# Patient Record
Sex: Male | Born: 1960
Health system: Southern US, Community
[De-identification: ages and names within clinical notes are randomized; demographics above are authoritative.]

## PROBLEM LIST (undated history)

## (undated) DIAGNOSIS — I10 Essential (primary) hypertension: Secondary | ICD-10-CM

## (undated) DIAGNOSIS — C801 Malignant (primary) neoplasm, unspecified: Secondary | ICD-10-CM

## (undated) DIAGNOSIS — F419 Anxiety disorder, unspecified: Secondary | ICD-10-CM

## (undated) DIAGNOSIS — E785 Hyperlipidemia, unspecified: Secondary | ICD-10-CM

## (undated) DIAGNOSIS — E119 Type 2 diabetes mellitus without complications: Secondary | ICD-10-CM

## (undated) DIAGNOSIS — K219 Gastro-esophageal reflux disease without esophagitis: Secondary | ICD-10-CM

## (undated) DIAGNOSIS — Z8673 Personal history of transient ischemic attack (TIA), and cerebral infarction without residual deficits: Secondary | ICD-10-CM

## (undated) DIAGNOSIS — K760 Fatty (change of) liver, not elsewhere classified: Secondary | ICD-10-CM

## (undated) HISTORY — DX: Essential (primary) hypertension: I10

## (undated) HISTORY — DX: Type 2 diabetes mellitus without complications: E11.9

## (undated) HISTORY — DX: Fatty (change of) liver, not elsewhere classified: K76.0

## (undated) HISTORY — DX: Personal history of transient ischemic attack (TIA), and cerebral infarction without residual deficits: Z86.73

## (undated) HISTORY — PX: NO PAST SURGERIES: SHX2092

## (undated) HISTORY — DX: Anxiety disorder, unspecified: F41.9

## (undated) HISTORY — DX: Hyperlipidemia, unspecified: E78.5

---

## 2003-07-27 ENCOUNTER — Ambulatory Visit (HOSPITAL_COMMUNITY): Admission: RE | Admit: 2003-07-27 | Discharge: 2003-07-27 | Payer: Self-pay | Admitting: Family Medicine

## 2004-01-08 ENCOUNTER — Ambulatory Visit (HOSPITAL_COMMUNITY): Admission: RE | Admit: 2004-01-08 | Discharge: 2004-01-08 | Payer: Self-pay | Admitting: Internal Medicine

## 2004-01-08 ENCOUNTER — Encounter (INDEPENDENT_AMBULATORY_CARE_PROVIDER_SITE_OTHER): Payer: Self-pay | Admitting: Specialist

## 2004-02-26 ENCOUNTER — Ambulatory Visit (HOSPITAL_COMMUNITY): Admission: RE | Admit: 2004-02-26 | Discharge: 2004-02-26 | Payer: Self-pay | Admitting: Internal Medicine

## 2004-04-22 ENCOUNTER — Ambulatory Visit: Payer: Self-pay | Admitting: Internal Medicine

## 2004-05-02 ENCOUNTER — Ambulatory Visit (HOSPITAL_COMMUNITY): Admission: RE | Admit: 2004-05-02 | Discharge: 2004-05-02 | Payer: Self-pay | Admitting: Internal Medicine

## 2004-11-17 ENCOUNTER — Ambulatory Visit (HOSPITAL_COMMUNITY): Admission: RE | Admit: 2004-11-17 | Discharge: 2004-11-17 | Payer: Self-pay | Admitting: Internal Medicine

## 2005-04-03 ENCOUNTER — Ambulatory Visit (HOSPITAL_COMMUNITY): Admission: RE | Admit: 2005-04-03 | Discharge: 2005-04-03 | Payer: Self-pay | Admitting: Internal Medicine

## 2005-06-04 ENCOUNTER — Encounter: Admission: RE | Admit: 2005-06-04 | Discharge: 2005-06-04 | Payer: Self-pay | Admitting: Occupational Medicine

## 2007-07-04 ENCOUNTER — Ambulatory Visit (HOSPITAL_COMMUNITY): Admission: RE | Admit: 2007-07-04 | Discharge: 2007-07-04 | Payer: Self-pay | Admitting: Family Medicine

## 2007-08-24 ENCOUNTER — Ambulatory Visit (HOSPITAL_COMMUNITY): Admission: RE | Admit: 2007-08-24 | Discharge: 2007-08-24 | Payer: Self-pay | Admitting: Family Medicine

## 2008-11-15 ENCOUNTER — Ambulatory Visit (HOSPITAL_COMMUNITY): Admission: RE | Admit: 2008-11-15 | Discharge: 2008-11-15 | Payer: Self-pay | Admitting: Family Medicine

## 2010-01-17 ENCOUNTER — Ambulatory Visit (HOSPITAL_COMMUNITY): Admission: RE | Admit: 2010-01-17 | Discharge: 2010-01-17 | Payer: Self-pay | Admitting: Urology

## 2010-08-08 LAB — HEMOGLOBIN AND HEMATOCRIT, BLOOD: HCT: 49.8 % (ref 39.0–52.0)

## 2010-08-08 LAB — BASIC METABOLIC PANEL
Calcium: 9.8 mg/dL (ref 8.4–10.5)
Chloride: 105 mEq/L (ref 96–112)
Creatinine, Ser: 0.94 mg/dL (ref 0.4–1.5)
Potassium: 4.2 mEq/L (ref 3.5–5.1)

## 2010-08-08 LAB — SURGICAL PCR SCREEN: MRSA, PCR: NEGATIVE

## 2010-08-08 LAB — GLUCOSE, CAPILLARY: Glucose-Capillary: 109 mg/dL — ABNORMAL HIGH (ref 70–99)

## 2012-10-21 ENCOUNTER — Ambulatory Visit: Payer: Self-pay | Admitting: Family Medicine

## 2012-10-26 ENCOUNTER — Ambulatory Visit (INDEPENDENT_AMBULATORY_CARE_PROVIDER_SITE_OTHER): Payer: BC Managed Care – PPO | Admitting: Family Medicine

## 2012-10-26 ENCOUNTER — Encounter: Payer: Self-pay | Admitting: Family Medicine

## 2012-10-26 VITALS — BP 170/90 | HR 90 | Wt 163.0 lb

## 2012-10-26 DIAGNOSIS — I10 Essential (primary) hypertension: Secondary | ICD-10-CM | POA: Insufficient documentation

## 2012-10-26 DIAGNOSIS — E119 Type 2 diabetes mellitus without complications: Secondary | ICD-10-CM | POA: Insufficient documentation

## 2012-10-26 DIAGNOSIS — E785 Hyperlipidemia, unspecified: Secondary | ICD-10-CM

## 2012-10-26 MED ORDER — AMLODIPINE BESYLATE 2.5 MG PO TABS: 2.5000 mg | ORAL_TABLET | Freq: Every day | ORAL | Status: DC

## 2012-10-26 MED ORDER — BISOPROLOL-HYDROCHLOROTHIAZIDE 10-6.25 MG PO TABS: 1.0000 | ORAL_TABLET | Freq: Every day | ORAL | Status: DC

## 2012-10-26 MED ORDER — RAMIPRIL 10 MG PO CAPS: 10.0000 mg | ORAL_CAPSULE | Freq: Every day | ORAL | Status: DC

## 2012-10-26 MED ORDER — METFORMIN HCL 500 MG PO TABS: ORAL_TABLET | ORAL | Status: DC

## 2012-10-26 MED ORDER — GLIMEPIRIDE 4 MG PO TABS: ORAL_TABLET | ORAL | Status: DC

## 2012-10-26 NOTE — Patient Instructions (Addendum)
Start checking sugars every morning--fasting. Call us in a couple weeks with numbers

## 2012-10-26 NOTE — Progress Notes (Signed)
  Subjective:    Patient ID: Victor Gonzales, male    DOB: [birth year], 52 y.o.   MRN: [id number]  Diabetes He has type 2 diabetes mellitus. His disease course has been stable. Pertinent negatives for hypoglycemia include no confusion or dizziness. Pertinent negatives for diabetes include no blurred vision and no chest pain. Pertinent negatives for hypoglycemia complications include no blackouts. Symptoms are stable. Pertinent negatives for diabetic complications include no nephropathy or retinopathy. Risk factors for coronary artery disease include diabetes mellitus. Current diabetic treatment includes oral agent (dual therapy). He is compliant with treatment most of the time. He is following a generally healthy diet. Meal planning includes avoidance of concentrated sweets. He has not had a previous visit with a dietician. He participates in exercise intermittently. There is no change in his home blood glucose trend. His breakfast blood glucose is taken between 7-8 am. His breakfast blood glucose range is generally 130-140 mg/dl. An ACE inhibitor/angiotensin II receptor blocker is being taken. Eye exam is current.   Results for orders placed in visit on 10/26/12  POCT GLYCOSYLATED HEMOGLOBIN (HGB A1C)      Result Value Range   Hemoglobin A1C 8.0     patient also has history of hypertension. He claims compliance with his medication. Generally when blood pressures are checked elsewhere it is in good range. Next  Patient is having unusual spells. He will suddenly feel lightheaded. This is often accompanied by a flash of light sensation. Generally lasts under 1 minute. Patient has been assuming it is because of low sugars. But he is not sure.  Patient also has history of hyperlipidemia. He is trying hard to watch his diet.  Review of Systems  Eyes: Negative for blurred vision.  Cardiovascular: Negative for chest pain.  Neurological: Negative for dizziness.  Psychiatric/Behavioral: Negative for  confusion.       Objective:   Physical Exam Alert no acute distress. HEENT normal. Blood pressure improved on repeat 146/88. Lungs clear. Heart regular rate and rhythm. Feet exam sensation intact pulses good      Assessment & Plan:  Impression #1 type 2 diabetes suboptimum A1c discussed at length. Medications reviewed. #2 hypertension controlled good. #3 hyperlipidemia #4 spells somewhat curious. Not really consistent with any clear etiology. Discussed with family. Plan medications prescribed. Diet discussed. Exercise encourage. Recheck in several months. If A1c not improved then will adjust medicines further. Easily 35 minutes spent most in discussion. WSL

## 2012-10-31 DIAGNOSIS — E785 Hyperlipidemia, unspecified: Secondary | ICD-10-CM | POA: Insufficient documentation

## 2013-01-25 ENCOUNTER — Encounter: Payer: Self-pay | Admitting: Family Medicine

## 2013-01-25 ENCOUNTER — Ambulatory Visit (INDEPENDENT_AMBULATORY_CARE_PROVIDER_SITE_OTHER): Payer: BC Managed Care – PPO | Admitting: Family Medicine

## 2013-01-25 VITALS — BP 122/88 | Ht 68.5 in | Wt 162.0 lb

## 2013-01-25 DIAGNOSIS — F411 Generalized anxiety disorder: Secondary | ICD-10-CM

## 2013-01-25 DIAGNOSIS — I10 Essential (primary) hypertension: Secondary | ICD-10-CM

## 2013-01-25 DIAGNOSIS — E785 Hyperlipidemia, unspecified: Secondary | ICD-10-CM

## 2013-01-25 DIAGNOSIS — E119 Type 2 diabetes mellitus without complications: Secondary | ICD-10-CM

## 2013-01-25 MED ORDER — ESCITALOPRAM OXALATE 10 MG PO TABS: 10.0000 mg | ORAL_TABLET | Freq: Every day | ORAL | Status: DC

## 2013-01-25 NOTE — Progress Notes (Signed)
  Subjective:    Patient ID: Victor Gonzales, male    DOB: 1960-11-15, 52 y.o.   MRN: [id number]  HPIPatient here for diabetes check up. Amaryl was prescribed for 1 qam and 1/2 at noon. Pt states he was only taking 1 qam.  Results for orders placed in visit on 01/25/13  POCT GLYCOSYLATED HEMOGLOBIN (HGB A1C)      Result Value Range   Hemoglobin A1C 6.1       Patient has been having fatigue, anxiety, and weight loss x 2 months.  Trying to eat better, 120 to 130.   Taking amaryl daily. Compliant with medication. No obvious side effects.   Feels tired nd stressed, feeling loss from sister passing on. Things start closing in, and gets very stress.feeling down. Back in 94 took meds for stress.  Trouble sleeping, not good.  Staying active, lot of walking with the job  Things changing at work, stress with that.energy level decent.          Review of Systems No chest pain no abdominal pain review systems otherwise negative    Objective:   Physical Exam  Alert no acute distress. HEENT normal. Lungs clear. Heart regular in rhythm. Ankles without edema feet sensation intact pulses good.      Assessment & Plan:  Impression #1 type 2 diabetes much improved discussed. #2 hypertension good control discussed. #3 generalized anxiety disorder with secondary symptoms discussed at great length. This is starting to create difficulties for the patient. He would like to start treatment. Plan initiate Lexapro 10 mg daily. Maintain other medications. Diet exercise discussed. Recheck in 3 months for a wellness exam. WSL 25 minutes spent most in discussion

## 2013-01-25 NOTE — Patient Instructions (Signed)
plz call us ten days ahead of the next visit, we'll

## 2013-01-26 DIAGNOSIS — F411 Generalized anxiety disorder: Secondary | ICD-10-CM | POA: Insufficient documentation

## 2013-04-24 ENCOUNTER — Telehealth: Payer: Self-pay | Admitting: Family Medicine

## 2013-04-24 NOTE — Telephone Encounter (Signed)
Patient had bloodwork done at his job. These labs included blood pressure, glucose, triglycerides, cholestrol, HDL and LDL, body mass, PSA. He is wanting to know if any other labs are needed?  He has an appointment this week and I told him to be sure to bring copy of those labs he had done.

## 2013-04-24 NOTE — Telephone Encounter (Signed)
Ok stick with that for now

## 2013-04-24 NOTE — Telephone Encounter (Signed)
Notified wife that the blood work ordered was fine. She verbalized understanding.

## 2013-04-26 ENCOUNTER — Encounter: Payer: Self-pay | Admitting: Family Medicine

## 2013-04-26 ENCOUNTER — Ambulatory Visit (INDEPENDENT_AMBULATORY_CARE_PROVIDER_SITE_OTHER): Payer: BC Managed Care – PPO | Admitting: Family Medicine

## 2013-04-26 VITALS — BP 118/78 | Ht 68.5 in | Wt 166.5 lb

## 2013-04-26 DIAGNOSIS — G4700 Insomnia, unspecified: Secondary | ICD-10-CM

## 2013-04-26 DIAGNOSIS — E782 Mixed hyperlipidemia: Secondary | ICD-10-CM

## 2013-04-26 DIAGNOSIS — Z125 Encounter for screening for malignant neoplasm of prostate: Secondary | ICD-10-CM

## 2013-04-26 DIAGNOSIS — Z79899 Other long term (current) drug therapy: Secondary | ICD-10-CM

## 2013-04-26 DIAGNOSIS — E119 Type 2 diabetes mellitus without complications: Secondary | ICD-10-CM

## 2013-04-26 LAB — POCT GLYCOSYLATED HEMOGLOBIN (HGB A1C): Hemoglobin A1C: 7.5

## 2013-04-26 MED ORDER — GLIMEPIRIDE 4 MG PO TABS: ORAL_TABLET | ORAL | Status: DC

## 2013-04-26 NOTE — Progress Notes (Signed)
   Subjective:    Patient ID: Victor Gonzales, male    DOB: 14-Mar-1961, 52 y.o.   MRN: [id number]  HPI  Patient arrives for a follow up on diabetes. Unfortunately has not been checking his sugar recently. When he did check the morning numbers were starting to elevate. Also admits to dietary noncompliance at times. Claims full compliance with medication.  Patient having problems sleeping at night. Reports considerable stress. Trouble getting to sleep at night. Worried a lot about various pains. No suicidal or homicidal thoughts  Compliant with blood pressure medicine. Trying to watch salt intake. Walking some.  Not cking sugars as much  Results for orders placed in visit on 04/26/13  POCT GLYCOSYLATED HEMOGLOBIN (HGB A1C)      Result Value Range   Hemoglobin A1C 7.5     Flu shot already given  Trying to watch diet better. Getting better with oral intake. Also dirinking less soft drink.    Review of Systems No headache no chest pain no abdominal pain no change in bowel habits ROS otherwise negative    Objective:   Physical Exam  Alert hydration good. HEENT normal neck supple. Lungs clear. Heart regular in rhythm. Ankles trace edema. Venous stasis changes noted.      Assessment & Plan:  Impression 1 type 2 diabetes suboptimum control. #2 hypertension good control. #3 insomnia discussed at length. Plan Klonopin 1 mg each bedtime when necessary for sleep. Maintain same blood pressure medication. Increase Amaryl to one 4 mg tablet in the morning and half to 2 mg tablet at night. Recheck in several months. Diet exercise discussed. WSL

## 2013-04-29 LAB — HEPATIC FUNCTION PANEL
ALT: 66 U/L — ABNORMAL HIGH (ref 0–53)
Albumin: 4.1 g/dL (ref 3.5–5.2)
Alkaline Phosphatase: 55 U/L (ref 39–117)
Indirect Bilirubin: 0.7 mg/dL (ref 0.0–0.9)

## 2013-04-29 LAB — BASIC METABOLIC PANEL
BUN: 13 mg/dL (ref 6–23)
Chloride: 100 mEq/L (ref 96–112)
Potassium: 4.9 mEq/L (ref 3.5–5.3)
Sodium: 139 mEq/L (ref 135–145)

## 2013-04-29 LAB — LIPID PANEL
HDL: 42 mg/dL (ref >39)
Total CHOL/HDL Ratio: 3.8 Ratio
Triglycerides: 90 mg/dL (ref <150)

## 2013-06-06 ENCOUNTER — Other Ambulatory Visit: Payer: Self-pay | Admitting: Family Medicine

## 2013-06-22 ENCOUNTER — Other Ambulatory Visit: Payer: Self-pay | Admitting: Family Medicine

## 2013-07-25 ENCOUNTER — Ambulatory Visit: Payer: BC Managed Care – PPO | Admitting: Family Medicine

## 2013-08-10 ENCOUNTER — Other Ambulatory Visit: Payer: Self-pay | Admitting: *Deleted

## 2013-08-10 ENCOUNTER — Encounter: Payer: Self-pay | Admitting: Family Medicine

## 2013-08-10 ENCOUNTER — Ambulatory Visit (INDEPENDENT_AMBULATORY_CARE_PROVIDER_SITE_OTHER): Payer: BC Managed Care – PPO | Admitting: Family Medicine

## 2013-08-10 ENCOUNTER — Telehealth: Payer: Self-pay | Admitting: Family Medicine

## 2013-08-10 VITALS — BP 154/90 | Ht 68.5 in | Wt 166.0 lb

## 2013-08-10 DIAGNOSIS — E119 Type 2 diabetes mellitus without complications: Secondary | ICD-10-CM

## 2013-08-10 LAB — POCT GLYCOSYLATED HEMOGLOBIN (HGB A1C): Hemoglobin A1C: 8.2

## 2013-08-10 MED ORDER — GLIMEPIRIDE 4 MG PO TABS: 4.0000 mg | ORAL_TABLET | Freq: Two times a day (BID) | ORAL | Status: DC

## 2013-08-10 NOTE — Telephone Encounter (Signed)
Pt has PE 11/22/13, need BW orders sent to lab, please call when done

## 2013-08-10 NOTE — Patient Instructions (Signed)
Change lexapro to every other morning the next ten days

## 2013-08-10 NOTE — Progress Notes (Signed)
   Subjective:    Patient ID: Victor Gonzales, male    DOB: 1961-02-28, 53 y.o.   MRN: [id number]  Diabetes He presents for his follow-up diabetic visit. He has type 2 diabetes mellitus. He participates in exercise three times a week. His breakfast blood glucose range is generally 130-140 mg/dl. He does not see a podiatrist.Eye exam is not current.   Results for orders placed in visit on 08/10/13  POCT GLYCOSYLATED HEMOGLOBIN (HGB A1C)      Result Value Ref Range   Hemoglobin A1C 8.2     Morn sugars in the past few weeks 140 109 130's   Diet wise has tighntened up considerably  Sleeping a lot better and less anxiety,    Review of Systems     Objective:   Physical Exam        Assessment & Plan:

## 2013-08-10 NOTE — Telephone Encounter (Signed)
08/10/13-HgbA1c and  04/29/13- Microalbumin Urine, Liver, Met 7, Lipid, PSA

## 2013-08-11 ENCOUNTER — Telehealth: Payer: Self-pay

## 2013-08-11 NOTE — Telephone Encounter (Signed)
Gastroenterology Pre-Procedure Review  Request Date: Requesting Physician: Loran SentersSteven Luking  PATIENT REVIEW QUESTIONS: The patient responded to the following health history questions as indicated:    1. Diabetes Melitis: YES 2. Joint replacements in the past 12 months: NO 3. Major health problems in the past 3 months: NO 4. Has an artificial valve or MVP: NO 5. Has a defibrillator: NO 6. Has been advised in past to take antibiotics in advance of a procedure like teeth cleaning: NO 7. Family history:NO 8. Acholic:NO     MEDICATIONS & ALLERGIES:    Patient reports the following regarding taking any blood thinners:   Plavix? NO Aspirin? YES Coumadin? NO  Patient confirms/reports the following medications:  Current Outpatient Prescriptions  Medication Sig Dispense Refill  . amLODipine (NORVASC) 2.5 MG tablet Take 1 tablet (2.5 mg total) by mouth daily.  90 tablet  1  . aspirin 81 MG tablet Take 81 mg by mouth daily.      . bisoprolol-hydrochlorothiazide (ZIAC) 10-6.25 MG per tablet Take 1 tablet by mouth daily.  90 tablet  1  . escitalopram (LEXAPRO) 10 MG tablet TAKE ONE TABLET BY MOUTH ONCE DAILY  30 tablet  5  . glimepiride (AMARYL) 4 MG tablet Take 1 tablet (4 mg total) by mouth 2 (two) times daily.  180 tablet  1  . metFORMIN (GLUCOPHAGE) 500 MG tablet TAKE 2 TABLETS TWICE A DAY  360 tablet  0  . Multiple Vitamin (MULTIVITAMIN) tablet Take 1 tablet by mouth daily.      . ramipril (ALTACE) 10 MG capsule TAKE 1 CAPSULE DAILY  90 capsule  0   No current facility-administered medications for this visit.    Patient confirms/reports the following allergies:  Allergies  Allergen Reactions  . Codeine     No orders of the defined types were placed in this encounter.    AUTHORIZATION INFORMATION Primary Insurance: ,  ID #: ,  Group #:  Pre-Cert / Auth required:  Pre-Cert / Auth #:    SCHEDULE INFORMATION: Procedure has been scheduled as follows:  Date: , Time:  Location:    This Gastroenterology Pre-Precedure Review Form is being routed to the following provider(s):    Pt would like to have SLF. He would like Friday April 17th or 24 th. He will call back with insurance information.

## 2013-08-15 ENCOUNTER — Other Ambulatory Visit: Payer: Self-pay

## 2013-08-15 ENCOUNTER — Other Ambulatory Visit: Payer: Self-pay | Admitting: Family Medicine

## 2013-08-15 DIAGNOSIS — Z1211 Encounter for screening for malignant neoplasm of colon: Secondary | ICD-10-CM

## 2013-08-21 NOTE — Telephone Encounter (Signed)
MOVI PREP SPLIT DOSING  CONTINUE GLUCOPHAGE. HOLD AMARYL ON AM OF TCS. FULL LIQUID DIET WITH BREAKFAST ON DAY BEFORE TCS. PHENERGAN 12.5 MG IV IN PREOP.

## 2013-08-21 NOTE — Telephone Encounter (Signed)
Routing to Dr. Darrick PennaFields to sign off on.  Pt is now scheduled for 09/15/2013 at 8:30 AM.

## 2013-08-24 MED ORDER — PEG-KCL-NACL-NASULF-NA ASC-C 100 G PO SOLR: 1.0000 | ORAL | Status: DC

## 2013-08-24 NOTE — Telephone Encounter (Signed)
Rx sent to the pharmacy and instructions mailed to pt.  

## 2013-08-28 ENCOUNTER — Encounter: Payer: Self-pay | Admitting: Family Medicine

## 2013-08-28 ENCOUNTER — Other Ambulatory Visit: Payer: Self-pay | Admitting: Gastroenterology

## 2013-08-28 ENCOUNTER — Ambulatory Visit (INDEPENDENT_AMBULATORY_CARE_PROVIDER_SITE_OTHER): Payer: BC Managed Care – PPO | Admitting: Family Medicine

## 2013-08-28 VITALS — BP 136/82 | Ht 68.5 in | Wt 165.0 lb

## 2013-08-28 DIAGNOSIS — M545 Low back pain, unspecified: Secondary | ICD-10-CM

## 2013-08-28 DIAGNOSIS — Z1211 Encounter for screening for malignant neoplasm of colon: Secondary | ICD-10-CM

## 2013-08-28 MED ORDER — ETODOLAC 400 MG PO TABS: 400.0000 mg | ORAL_TABLET | Freq: Two times a day (BID) | ORAL | Status: DC

## 2013-08-28 MED ORDER — CHLORZOXAZONE 500 MG PO TABS: 500.0000 mg | ORAL_TABLET | Freq: Three times a day (TID) | ORAL | Status: DC

## 2013-08-28 NOTE — Progress Notes (Signed)
   Subjective:    Patient ID: Victor Gonzales, male    DOB: 07-09-1960, 53 y.o.   MRN: [id number]  HPI Patient arrives with complaint of back pain and spasms since Friday. 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  Low back aching and pulling  thur and fri was weed eating and mowing  Fairly hard  weedeater cranking lasted an houfr  advil takes two--hlped some  Applying the local patches  Off and on sudden grabbing, bad pain at times Rad to low lumb but not legs    Review of Systems No change in urinary or bowel habits no abdominal pain no chest pain ROS otherwise negative    Objective:   Physical Exam Alert mild malaise. Lungs clear heart regular in rhythm. Diffuse low lumbar tenderness. Negative straight leg raise. Abdomen benign.       Assessment & Plan:  Impression lumbar strain discussed plan Lodine 400 mg twice a day. Chlorzoxazone 500 3 times a day. Local measures discussed. Work excuse couple days. WSL

## 2013-08-31 ENCOUNTER — Encounter (HOSPITAL_COMMUNITY): Payer: Self-pay | Admitting: Pharmacy Technician

## 2013-09-14 ENCOUNTER — Other Ambulatory Visit: Payer: Self-pay | Admitting: Gastroenterology

## 2013-09-14 ENCOUNTER — Other Ambulatory Visit: Payer: Self-pay | Admitting: Family Medicine

## 2013-09-14 DIAGNOSIS — Z1211 Encounter for screening for malignant neoplasm of colon: Secondary | ICD-10-CM

## 2013-09-14 MED ORDER — SODIUM CHLORIDE 0.9 % IV SOLN: INTRAVENOUS | Status: DC

## 2013-09-15 ENCOUNTER — Encounter (HOSPITAL_COMMUNITY): Payer: Self-pay | Admitting: *Deleted

## 2013-09-15 ENCOUNTER — Encounter (HOSPITAL_COMMUNITY)
Admission: RE | Disposition: A | Discharge status: Home Health | Payer: Self-pay | Source: Ambulatory Visit | Attending: Gastroenterology

## 2013-09-15 ENCOUNTER — Ambulatory Visit (HOSPITAL_COMMUNITY)
Admission: RE | Admit: 2013-09-15 | Discharge: 2013-09-15 | Disposition: A | Discharge status: Home Health | Payer: BC Managed Care – PPO | Source: Ambulatory Visit | Attending: Gastroenterology | Admitting: Gastroenterology

## 2013-09-15 DIAGNOSIS — Z1211 Encounter for screening for malignant neoplasm of colon: Secondary | ICD-10-CM

## 2013-09-15 DIAGNOSIS — Z7982 Long term (current) use of aspirin: Secondary | ICD-10-CM | POA: Insufficient documentation

## 2013-09-15 DIAGNOSIS — E785 Hyperlipidemia, unspecified: Secondary | ICD-10-CM | POA: Insufficient documentation

## 2013-09-15 DIAGNOSIS — I10 Essential (primary) hypertension: Secondary | ICD-10-CM | POA: Insufficient documentation

## 2013-09-15 DIAGNOSIS — K648 Other hemorrhoids: Secondary | ICD-10-CM | POA: Insufficient documentation

## 2013-09-15 DIAGNOSIS — E119 Type 2 diabetes mellitus without complications: Secondary | ICD-10-CM | POA: Insufficient documentation

## 2013-09-15 DIAGNOSIS — Z79899 Other long term (current) drug therapy: Secondary | ICD-10-CM | POA: Insufficient documentation

## 2013-09-15 HISTORY — PX: COLONOSCOPY: SHX5424

## 2013-09-15 HISTORY — DX: Gastro-esophageal reflux disease without esophagitis: K21.9

## 2013-09-15 LAB — GLUCOSE, CAPILLARY: Glucose-Capillary: 137 mg/dL — ABNORMAL HIGH (ref 70–99)

## 2013-09-15 SURGERY — COLONOSCOPY
Anesthesia: Moderate Sedation

## 2013-09-15 MED ORDER — PROMETHAZINE HCL 25 MG/ML IJ SOLN
12.5000 mg | Freq: Once | INTRAMUSCULAR | Status: AC
  Administered 2013-09-15: 12.5 mg via INTRAVENOUS

## 2013-09-15 MED ORDER — MEPERIDINE HCL 100 MG/ML IJ SOLN
INTRAMUSCULAR | Status: DC | PRN
  Administered 2013-09-15: 50 mg via INTRAVENOUS

## 2013-09-15 MED ORDER — MIDAZOLAM HCL 5 MG/5ML IJ SOLN
INTRAMUSCULAR | Status: DC | PRN
  Administered 2013-09-15: 1 mg via INTRAVENOUS
  Administered 2013-09-15: 2 mg via INTRAVENOUS

## 2013-09-15 MED ORDER — MIDAZOLAM HCL 5 MG/5ML IJ SOLN
INTRAMUSCULAR | Status: AC
  Filled 2013-09-15: qty 10

## 2013-09-15 MED ORDER — MEPERIDINE HCL 100 MG/ML IJ SOLN
INTRAMUSCULAR | Status: AC
  Filled 2013-09-15: qty 2

## 2013-09-15 MED ORDER — SODIUM CHLORIDE 0.9 % IV SOLN
INTRAVENOUS | Status: DC
  Administered 2013-09-15: 1000 mL via INTRAVENOUS

## 2013-09-15 MED ORDER — PROMETHAZINE HCL 25 MG/ML IJ SOLN
INTRAMUSCULAR | Status: AC
  Filled 2013-09-15: qty 1

## 2013-09-15 MED ORDER — SODIUM CHLORIDE 0.9 % IJ SOLN
INTRAMUSCULAR | Status: AC
  Filled 2013-09-15: qty 10

## 2013-09-15 MED ORDER — STERILE WATER FOR IRRIGATION IR SOLN
Status: DC | PRN
  Administered 2013-09-15: 09:00:00

## 2013-09-15 NOTE — Discharge Instructions (Signed)
You have internal hemorrhoids. YOU DID NOT HAVE ANY POLYPS.    FOLLOW A HIGH FIBER DIET. AVOID ITEMS THAT CAUSE BLOATING. SEE INFO BELOW.  DRINK WATER TO KEEP YOUR URINE LIGHT YELLOW.  Next colonoscopy in 10 years.   Colonoscopy Care After Read the instructions outlined below and refer to this sheet in the next week. These discharge instructions provide you with general information on caring for yourself after you leave the hospital. While your treatment has been planned according to the most current medical practices available, unavoidable complications occasionally occur. If you have any problems or questions after discharge, call DR. FIELDS, [phone].  ACTIVITY  You may resume your regular activity, but move at a slower pace for the next 24 hours.   Take frequent rest periods for the next 24 hours.   Walking will help get rid of the air and reduce the bloated feeling in your belly (abdomen).   No driving for 24 hours (because of the medicine (anesthesia) used during the test).   You may shower.   Do not sign any important legal documents or operate any machinery for 24 hours (because of the anesthesia used during the test).    NUTRITION  Drink plenty of fluids.   You may resume your normal diet as instructed by your doctor.   Begin with a light meal and progress to your normal diet. Heavy or fried foods are harder to digest and may make you feel sick to your stomach (nauseated).   Avoid alcoholic beverages for 24 hours or as instructed.    MEDICATIONS  You may resume your normal medications.   WHAT YOU CAN EXPECT TODAY  Some feelings of bloating in the abdomen.   Passage of more gas than usual.   Spotting of blood in your stool or on the toilet paper  .  IF YOU HAD POLYPS REMOVED DURING THE COLONOSCOPY:  Eat a soft diet IF YOU HAVE NAUSEA, BLOATING, ABDOMINAL PAIN, OR VOMITING.    FINDING OUT THE RESULTS OF YOUR TEST Not all test results are  available during your visit. DR. Darrick PennaFIELDS WILL CALL YOU WITHIN 7 DAYS OF YOUR PROCEDUE WITH YOUR RESULTS. Do not assume everything is normal if you have not heard from DR. FIELDS IN ONE WEEK, CALL HER OFFICE AT [phone].  SEEK IMMEDIATE MEDICAL ATTENTION AND CALL THE OFFICE: [phone] IF:  You have more than a spotting of blood in your stool.   Your belly is swollen (abdominal distention).   You are nauseated or vomiting.   You have a temperature over 101F.   You have abdominal pain or discomfort that is severe or gets worse throughout the day.  High-Fiber Diet A high-fiber diet changes your normal diet to include more whole grains, legumes, fruits, and vegetables. Changes in the diet involve replacing refined carbohydrates with unrefined foods. The calorie level of the diet is essentially unchanged. The Dietary Reference Intake (recommended amount) for adult males is 38 grams per day. For adult females, it is 25 grams per day. Pregnant and lactating women should consume 28 grams of fiber per day. Fiber is the intact part of a plant that is not broken down during digestion. Functional fiber is fiber that has been isolated from the plant to provide a beneficial effect in the body. PURPOSE  Increase stool bulk.   Ease and regulate bowel movements.   Lower cholesterol.  INDICATIONS THAT YOU NEED MORE FIBER  Constipation and hemorrhoids.   Uncomplicated diverticulosis (intestine condition) and  irritable bowel syndrome.   Weight management.   As a protective measure against hardening of the arteries (atherosclerosis), diabetes, and cancer.   GUIDELINES FOR INCREASING FIBER IN THE DIET  Start adding fiber to the diet slowly. A gradual increase of about 5 more grams (2 slices of whole-wheat bread, 2 servings of most fruits or vegetables, or 1 bowl of high-fiber cereal) per day is best. Too rapid an increase in fiber may result in constipation, flatulence, and bloating.   Drink  enough water and fluids to keep your urine clear or pale yellow. Water, juice, or caffeine-free drinks are recommended. Not drinking enough fluid may cause constipation.   Eat a variety of high-fiber foods rather than one type of fiber.   Try to increase your intake of fiber through using high-fiber foods rather than fiber pills or supplements that contain small amounts of fiber.   The goal is to change the types of food eaten. Do not supplement your present diet with high-fiber foods, but replace foods in your present diet.  INCLUDE A VARIETY OF FIBER SOURCES  Replace refined and processed grains with whole grains, canned fruits with fresh fruits, and incorporate other fiber sources. White rice, white breads, and most bakery goods contain little or no fiber.   Brown whole-grain rice, buckwheat oats, and many fruits and vegetables are all good sources of fiber. These include: broccoli, Brussels sprouts, cabbage, cauliflower, beets, sweet potatoes, white potatoes (skin on), carrots, tomatoes, eggplant, squash, berries, fresh fruits, and dried fruits.   Cereals appear to be the richest source of fiber. Cereal fiber is found in whole grains and bran. Bran is the fiber-rich outer coat of cereal grain, which is largely removed in refining. In whole-grain cereals, the bran remains. In breakfast cereals, the largest amount of fiber is found in those with "bran" in their names. The fiber content is sometimes indicated on the label.   You may need to include additional fruits and vegetables each day.   In baking, for 1 cup white flour, you may use the following substitutions:   1 cup whole-wheat flour minus 2 tablespoons.   1/2 cup white flour plus 1/2 cup whole-wheat flour.   Hemorrhoids Hemorrhoids are dilated (enlarged) veins around the rectum. Sometimes clots will form in the veins. This makes them swollen and painful. These are called thrombosed hemorrhoids. Causes of hemorrhoids  include:  Constipation.   Straining to have a bowel movement.   HEAVY LIFTING HOME CARE INSTRUCTIONS  Eat a well balanced diet and drink 6 to 8 glasses of water every day to avoid constipation. You may also use a bulk laxative.   Avoid straining to have bowel movements.   Keep anal area dry and clean.   Do not use a donut shaped pillow or sit on the toilet for long periods. This increases blood pooling and pain.   Move your bowels when your body has the urge; this will require less straining and will decrease pain and pressure.

## 2013-09-15 NOTE — Op Note (Addendum)
[facility] [address] [place], [id number]   COLONOSCOPY PROCEDURE REPORT  PATIENT: Victor Gonzales, Victor V.  MR#: [id number] BIRTHDATE: 1961/03/19 , 53  yrs. old GENDER: Male ENDOSCOPIST: Jonette EvaSandi Deryk Bozman, MD REFERRED ZD:[id number]:Stephen Gerda DissLuking, M.D. PROCEDURE DATE:  09/15/2013 PROCEDURE:   Colonoscopy, screening INDICATIONS:Average risk patient for colon cancer.  SISTER HAD 2 POLYPS REMOVED AT AGE 49. MEDICATIONS: Demerol 50 mg IV and Versed 3 mg IV  DESCRIPTION OF PROCEDURE:    Physical exam was performed.  Informed consent was obtained from the patient after explaining the benefits, risks, and alternatives to procedure.  The patient was connected to monitor and placed in left lateral position. Continuous oxygen was provided by nasal cannula and IV medicine administered through an indwelling cannula.  After administration of sedation and rectal exam, the patients rectum was intubated and the EC-[id number] ([id number]  colonoscope was advanced under direct visualization to the ileum.  The scope was removed slowly by carefully examining the color, texture, anatomy, and integrity mucosa on the way out.  The patient was recovered in endoscopy and discharged home in satisfactory condition.    COLON FINDINGS: A normal appearing cecum, ileocecal valve, and appendiceal orifice were identified.  The ascending, hepatic flexure, transverse, splenic flexure, descending, sigmoid colon and rectum appeared unremarkable.  No polyps or cancers were seen and Small internal hemorrhoids were found.  PREP QUALITY: good CECAL W/D TIME: 8 minutes     COMPLICATIONS: None  ENDOSCOPIC IMPRESSION: 1.   Normal colon 2.   Small internal hemorrhoids  RECOMMENDATIONS: DRINK WATER HIGH FIBER DIET TCS IN 10 YEARS       _______________________________ eSignedJonette Eva:  Josue Falconi, MD 09/15/2013 9:02 AM

## 2013-09-15 NOTE — H&P (Signed)
  Primary Care Physician:  Harlow AsaLUKING,W S, MD Primary Gastroenterologist:  Dr. Darrick PennaFields  Pre-Procedure History & Physical: HPI:  Victor Gonzales is a 53 y.o. male here for COLON CANCER SCREENING.  Past Medical History  Diagnosis Date  . Diabetes mellitus without complication   . Hypertension   . Hyperlipidemia   . GERD (gastroesophageal reflux disease)     Past Surgical History  Procedure Laterality Date  . No past surgeries      Prior to Admission medications   Medication Sig Start Date End Date Taking? Authorizing Provider  amLODipine (NORVASC) 2.5 MG tablet TAKE 1 TABLET DAILY 08/15/13  Yes Merlyn AlbertWilliam S Luking, MD  aspirin 81 MG tablet Take 81 mg by mouth daily.   Yes Historical Provider, MD  bisoprolol-hydrochlorothiazide [place](ZIAC) 10-6.25 MG per tablet TAKE 1 TABLET DAILY 09/14/13  Yes Babs SciaraScott A Luking, MD  chlorzoxazone (PARAFON) 500 MG tablet Take 1 tablet (500 mg total) by mouth 3 (three) times daily. 08/28/13  Yes Merlyn AlbertWilliam S Luking, MD  escitalopram (LEXAPRO) 10 MG tablet TAKE ONE TABLET BY MOUTH ONCE DAILY 06/06/13  Yes Merlyn AlbertWilliam S Luking, MD  glimepiride (AMARYL) 4 MG tablet Take 1 tablet (4 mg total) by mouth 2 (two) times daily. 08/10/13  Yes Merlyn AlbertWilliam S Luking, MD  metFORMIN (GLUCOPHAGE) 500 MG tablet TAKE 2 TABLETS TWICE A DAY 06/22/13  Yes Merlyn AlbertWilliam S Luking, MD  Multiple Vitamin (MULTIVITAMIN) tablet Take 1 tablet by mouth daily.   Yes Historical Provider, MD  ramipril (ALTACE) 10 MG capsule TAKE 1 CAPSULE DAILY 06/22/13  Yes Merlyn AlbertWilliam S Luking, MD  etodolac (LODINE) 400 MG tablet Take 1 tablet (400 mg total) by mouth 2 (two) times daily. 08/28/13   Merlyn AlbertWilliam S Luking, MD    Allergies as of 08/15/2013 - Review Complete 08/10/2013  Allergen Reaction Noted  . Codeine  10/26/2012    Family History  Problem Relation Age of Onset  . Heart disease Mother   . Diabetes Mother   . Heart disease Brother   . Lung disease Father   . Cancer - Ovarian Sister     History   Social History  . Marital  Status: Married    Spouse Name: N/A    Number of Children: N/A  . Years of Education: N/A   Occupational History  . Not on file.   Social History Main Topics  . Smoking status: Never Smoker   . Smokeless tobacco: Not on file  . Alcohol Use: No  . Drug Use: No  . Sexual Activity: Not on file   Other Topics Concern  . Not on file   Social History Narrative  . No narrative on file    Review of Systems: See HPI, otherwise negative ROS   Physical Exam: BP 173/95  Pulse 71  Temp(Src) 97.8 F (36.6 C) (Oral)  Resp 19  SpO2 98% General:   Alert,  pleasant and cooperative in NAD Head:  Normocephalic and atraumatic. Neck:  Supple; Lungs:  Clear throughout to auscultation.    Heart:  Regular rate and rhythm. Abdomen:  Soft, nontender and nondistended. Normal bowel sounds, without guarding, and without rebound.   Neurologic:  Alert and  oriented x4;  grossly normal neurologically.  Impression/Plan:     SCREENING  Plan:  1. TCS TODAY

## 2013-09-19 ENCOUNTER — Encounter (HOSPITAL_COMMUNITY): Payer: Self-pay | Admitting: Gastroenterology

## 2013-09-26 ENCOUNTER — Other Ambulatory Visit: Payer: Self-pay | Admitting: Family Medicine

## 2013-10-20 ENCOUNTER — Other Ambulatory Visit: Payer: Self-pay | Admitting: Family Medicine

## 2013-11-06 ENCOUNTER — Encounter: Payer: Self-pay | Admitting: Family Medicine

## 2013-11-20 ENCOUNTER — Telehealth: Payer: Self-pay | Admitting: Family Medicine

## 2013-11-20 DIAGNOSIS — I10 Essential (primary) hypertension: Secondary | ICD-10-CM

## 2013-11-20 DIAGNOSIS — E119 Type 2 diabetes mellitus without complications: Secondary | ICD-10-CM

## 2013-11-20 DIAGNOSIS — Z79899 Other long term (current) drug therapy: Secondary | ICD-10-CM

## 2013-11-20 NOTE — Telephone Encounter (Signed)
Lip liv A1c 

## 2013-11-20 NOTE — Addendum Note (Signed)
Addended by: Dereck LigasJOHNSON, CALANDRA P on: 11/20/2013 01:45 PM   Modules accepted: Orders

## 2013-11-20 NOTE — Telephone Encounter (Signed)
Wife was notified that blood work has been ordered and patient can report to the lab.

## 2013-11-20 NOTE — Telephone Encounter (Signed)
Patient has appointment on 7/1 and needing labs done before he comes in.

## 2013-11-21 LAB — LIPID PANEL
CHOL/HDL RATIO: 3.8 ratio
Cholesterol: 167 mg/dL (ref 0–200)
HDL: 44 mg/dL (ref >39)
LDL CALC: 99 mg/dL (ref 0–99)
TRIGLYCERIDES: 120 mg/dL (ref <150)
VLDL: 24 mg/dL (ref 0–40)

## 2013-11-21 LAB — HEPATIC FUNCTION PANEL
ALT: 54 U/L — AB (ref 0–53)
AST: 37 U/L (ref 0–37)
Albumin: 4.4 g/dL (ref 3.5–5.2)
Alkaline Phosphatase: 59 U/L (ref 39–117)
BILIRUBIN INDIRECT: 0.5 mg/dL (ref 0.2–1.2)
Bilirubin, Direct: 0.2 mg/dL (ref 0.0–0.3)
TOTAL PROTEIN: 6.5 g/dL (ref 6.0–8.3)
Total Bilirubin: 0.7 mg/dL (ref 0.2–1.2)

## 2013-11-21 LAB — HEMOGLOBIN A1C
Hgb A1c MFr Bld: 8.1 % — ABNORMAL HIGH (ref <5.7)
MEAN PLASMA GLUCOSE: 186 mg/dL — AB (ref <117)

## 2013-11-22 ENCOUNTER — Ambulatory Visit (INDEPENDENT_AMBULATORY_CARE_PROVIDER_SITE_OTHER): Payer: BC Managed Care – PPO | Admitting: Family Medicine

## 2013-11-22 ENCOUNTER — Encounter: Payer: Self-pay | Admitting: Family Medicine

## 2013-11-22 VITALS — BP 138/86 | Ht 68.5 in | Wt 165.4 lb

## 2013-11-22 DIAGNOSIS — Z0000 Encounter for general adult medical examination without abnormal findings: Secondary | ICD-10-CM

## 2013-11-22 NOTE — Progress Notes (Signed)
Subjective:    Patient ID: Victor Gonzales, male    DOB: 11/28/1960, 53 y.o.   MRN: [id number]  HPI  The patient comes in today for a wellness visit.    A review of their health history was completed.  A review of medications was also completed.  Any needed refills; no  Eating habits: eats good  Falls/  MVA accidents in past few months: no  Regular exercise: walking  Specialist pt sees on regular basis: no  Preventative health issues were discussed.   Additional concerns: discuss recent labwork  Results for orders placed in visit on 11/20/13  LIPID PANEL      Result Value Ref Range   Cholesterol 167  0 - 200 mg/dL   Triglycerides 409120  <811<150 mg/dL   HDL 44  >91>39 mg/dL   Total CHOL/HDL Ratio 3.8     VLDL 24  0 - 40 mg/dL   LDL Cholesterol 99  0 - 99 mg/dL  HEPATIC FUNCTION PANEL      Result Value Ref Range   Total Bilirubin 0.7  0.2 - 1.2 mg/dL   Bilirubin, Direct 0.2  0.0 - 0.3 mg/dL   Indirect Bilirubin 0.5  0.2 - 1.2 mg/dL   Alkaline Phosphatase 59  39 - 117 U/L   AST 37  0 - 37 U/L   ALT 54 (*) 0 - 53 U/L   Total Protein 6.5  6.0 - 8.3 g/dL   Albumin 4.4  3.5 - 5.2 g/dL  HEMOGLOBIN Y7WA1C      Result Value Ref Range   Hemoglobin A1C 8.1 (*) <5.7 %   Mean Plasma Glucose 186 (*) <117 mg/dL   cking and seeing morn sugar around 150 or so Has a gym at home, not using it much lately, no low sugar spells.  pneum  Doing so so on the diet  Saw eye doc this yr no sig changes  Colonoscopy donre normal except mild hemmorhoids  Review of Systems  Constitutional: Negative for fever, activity change and appetite change.  HENT: Negative for congestion and rhinorrhea.   Eyes: Negative for discharge.  Respiratory: Negative for cough and wheezing.   Cardiovascular: Negative for chest pain.  Gastrointestinal: Negative for vomiting, abdominal pain and blood in stool.  Genitourinary: Negative for frequency and difficulty urinating.  Musculoskeletal: Negative for neck  pain.  Skin: Negative for rash.  Allergic/Immunologic: Negative for environmental allergies and food allergies.  Neurological: Negative for weakness and headaches.  Psychiatric/Behavioral: Negative for agitation.  All other systems reviewed and are negative.      Objective:   Physical Exam  Constitutional: He appears well-developed and well-nourished.  HENT:  Head: Normocephalic and atraumatic.  Right Ear: External ear normal.  Left Ear: External ear normal.  Nose: Nose normal.  Mouth/Throat: Oropharynx is clear and moist.  Eyes: EOM are normal. Pupils are equal, round, and reactive to light.  Neck: Normal range of motion. Neck supple. No thyromegaly present.  Cardiovascular: Normal rate, regular rhythm and normal heart sounds.   No murmur heard. Pulmonary/Chest: Effort normal and breath sounds normal. No respiratory distress. He has no wheezes.  Abdominal: Soft. Bowel sounds are normal. He exhibits no distension and no mass. There is no tenderness.  Genitourinary: Penis normal.  Musculoskeletal: Normal range of motion. He exhibits no edema.  Lymphadenopathy:    He has no cervical adenopathy.  Neurological: He is alert. He exhibits normal muscle tone.  Skin: Skin is warm and dry. No erythema.  Psychiatric: He has a normal mood and affect. His behavior is normal. Judgment normal.          Assessment & Plan:  Impression #1 wellness exam #2 type 2 diabetes. #3 hypertension. Plan A1c still suboptimal in patient to work hard on diet and exercise. Still this elevated in several months will add additional medicine. Up-to-date on colonoscopy. Diet exercise discussed. Up-to-date on eye exam. WSL

## 2013-12-14 ENCOUNTER — Ambulatory Visit (INDEPENDENT_AMBULATORY_CARE_PROVIDER_SITE_OTHER): Payer: BC Managed Care – PPO | Admitting: Family Medicine

## 2013-12-14 ENCOUNTER — Encounter: Payer: Self-pay | Admitting: Family Medicine

## 2013-12-14 VITALS — BP 142/80 | Temp 98.3°F | Ht 68.5 in | Wt 162.5 lb

## 2013-12-14 DIAGNOSIS — S80819A Abrasion, unspecified lower leg, initial encounter: Secondary | ICD-10-CM

## 2013-12-14 DIAGNOSIS — S8012XA Contusion of left lower leg, initial encounter: Secondary | ICD-10-CM

## 2013-12-14 DIAGNOSIS — IMO0002 Reserved for concepts with insufficient information to code with codable children: Secondary | ICD-10-CM

## 2013-12-14 DIAGNOSIS — S8010XA Contusion of unspecified lower leg, initial encounter: Secondary | ICD-10-CM

## 2013-12-14 DIAGNOSIS — Z23 Encounter for immunization: Secondary | ICD-10-CM

## 2013-12-14 MED ORDER — DOXYCYCLINE HYCLATE 100 MG PO CAPS: 100.0000 mg | ORAL_CAPSULE | Freq: Two times a day (BID) | ORAL | Status: DC

## 2013-12-14 NOTE — Patient Instructions (Signed)
Warm compresses 10 minutes at a time 3 times a day  If increased redness then use the antibiotic as directed  Call if problems

## 2013-12-14 NOTE — Progress Notes (Signed)
   Subjective:    Patient ID: Victor SpearingAlvin V Sotomayor, male    DOB: 16-Sep-1960, 53 y.o.   MRN: [id number]  Laceration  The incident occurred 2 days ago. The laceration is located on the left leg. The laceration mechanism was a metal edge. The pain is mild. The pain has been constant since onset. It is unknown if a foreign body is present. His tetanus status is UTD.  Patient thinks he may have something stuck in his toe also.   Injury happened 2 days ago.  Review of Systems Relates pain discomfort shortness denies fever    Objective:   Physical Exam No sign of any type of foreign body in the toe. Knee normal Normal ankle normal foot normal has significant abrasion on the left leg with some secondary redness around it but no obvious cellulitis      Assessment & Plan:  Tetanus shot today  Antibiotic prescription given get this filled and take it if redness worsens. Followup if ongoing troubles warning signs for cellulitis discussed. Off work through Monday

## 2014-02-07 ENCOUNTER — Other Ambulatory Visit: Payer: Self-pay | Admitting: Family Medicine

## 2014-02-26 ENCOUNTER — Ambulatory Visit: Payer: BC Managed Care – PPO | Admitting: Family Medicine

## 2014-02-28 ENCOUNTER — Telehealth: Payer: Self-pay | Admitting: Family Medicine

## 2014-02-28 NOTE — Telephone Encounter (Signed)
Patient needs BW ordered for appointment next week.

## 2014-02-28 NOTE — Telephone Encounter (Signed)
Patient had Lipid, Liver, Hgba1c 11/20/13 and PSA, Micro albumin urine and met 7 04/29/13

## 2014-02-28 NOTE — Telephone Encounter (Signed)
Just A1C here

## 2014-03-01 NOTE — Telephone Encounter (Signed)
Patient notified

## 2014-03-06 ENCOUNTER — Ambulatory Visit (INDEPENDENT_AMBULATORY_CARE_PROVIDER_SITE_OTHER): Payer: BC Managed Care – PPO | Admitting: Family Medicine

## 2014-03-06 ENCOUNTER — Encounter: Payer: Self-pay | Admitting: Family Medicine

## 2014-03-06 VITALS — BP 130/80 | Ht 68.5 in | Wt 164.8 lb

## 2014-03-06 DIAGNOSIS — E785 Hyperlipidemia, unspecified: Secondary | ICD-10-CM

## 2014-03-06 DIAGNOSIS — I10 Essential (primary) hypertension: Secondary | ICD-10-CM

## 2014-03-06 DIAGNOSIS — E119 Type 2 diabetes mellitus without complications: Secondary | ICD-10-CM

## 2014-03-06 LAB — POCT GLYCOSYLATED HEMOGLOBIN (HGB A1C): HEMOGLOBIN A1C: 8.3

## 2014-03-06 MED ORDER — CANAGLIFLOZIN 100 MG PO TABS: 100.0000 mg | ORAL_TABLET | Freq: Every day | ORAL | Status: DC

## 2014-03-06 MED ORDER — CYCLOBENZAPRINE HCL 10 MG PO TABS: 10.0000 mg | ORAL_TABLET | Freq: Three times a day (TID) | ORAL | Status: DC | PRN

## 2014-03-06 NOTE — Progress Notes (Signed)
Subjective:    Patient ID: Victor SpearingAlvin V Badolato, male    DOB: [birth year], 53 y.o.   MRN: [id number]  Diabetes He presents for his follow-up diabetic visit. He has type 2 diabetes mellitus. There are no hypoglycemic associated symptoms. There are no diabetic associated symptoms. There are no diabetic complications. Risk factors for coronary artery disease include hypertension and diabetes mellitus. Current diabetic treatment includes oral agent (dual therapy). He is compliant with treatment all of the time.    Patient having legs at night.cramps uaually strike at night time  Most night wakes him up  Patient claims compliance with blood pressure medication. No obvious side effects. Trying to watch salt intake.  A lot of cramps. Usually mid thigh. Left or right leg. Generally nighttime.    Been acting up off and on for a long time but now worse recently  Results for orders placed in visit on 11/20/13  LIPID PANEL      Result Value Ref Range   Cholesterol 167  0 - 200 mg/dL   Triglycerides 409120  <811<150 mg/dL   HDL 44  >91>39 mg/dL   Total CHOL/HDL Ratio 3.8     VLDL 24  0 - 40 mg/dL   LDL Cholesterol 99  0 - 99 mg/dL  HEPATIC FUNCTION PANEL      Result Value Ref Range   Total Bilirubin 0.7  0.2 - 1.2 mg/dL   Bilirubin, Direct 0.2  0.0 - 0.3 mg/dL   Indirect Bilirubin 0.5  0.2 - 1.2 mg/dL   Alkaline Phosphatase 59  39 - 117 U/L   AST 37  0 - 37 U/L   ALT 54 (*) 0 - 53 U/L   Total Protein 6.5  6.0 - 8.3 g/dL   Albumin 4.4  3.5 - 5.2 g/dL  HEMOGLOBIN Y7WA1C      Result Value Ref Range   Hemoglobin A1C 8.1 (*) <5.7 %   Mean Plasma Glucose 186 (*) <117 mg/dL   Hx of fatty liver Results for orders placed in visit on 11/20/13  LIPID PANEL      Result Value Ref Range   Cholesterol 167  0 - 200 mg/dL   Triglycerides 295120  <621<150 mg/dL   HDL 44  >30>39 mg/dL   Total CHOL/HDL Ratio 3.8     VLDL 24  0 - 40 mg/dL   LDL Cholesterol 99  0 - 99 mg/dL  HEPATIC FUNCTION PANEL      Result Value Ref Range    Total Bilirubin 0.7  0.2 - 1.2 mg/dL   Bilirubin, Direct 0.2  0.0 - 0.3 mg/dL   Indirect Bilirubin 0.5  0.2 - 1.2 mg/dL   Alkaline Phosphatase 59  39 - 117 U/L   AST 37  0 - 37 U/L   ALT 54 (*) 0 - 53 U/L   Total Protein 6.5  6.0 - 8.3 g/dL   Albumin 4.4  3.5 - 5.2 g/dL  HEMOGLOBIN Q6VA1C      Result Value Ref Range   Hemoglobin A1C 8.1 (*) <5.7 %   Mean Plasma Glucose 186 (*) <117 mg/dL    Review of Systems No headache no chest pain no back pain no abdominal pain no change in bowel habits no blood in stool    Objective:   Physical Exam Alert blood pressure good on repeat. HEENT normal. Lungs clear. Heart rare rhythm. Ankles edema.       Assessment & Plan:  Impression 1 type 2 diabetes control  still suboptimal hypertension good control. #3 leg cramps discussed plan initiate Zanaflex each bedtime when necessary for cramps. Hadinvokan100 mg daily. Rationale discussed. Patient to some blood work our way. WSL

## 2014-03-30 ENCOUNTER — Other Ambulatory Visit: Payer: Self-pay | Admitting: Family Medicine

## 2014-04-23 ENCOUNTER — Telehealth: Payer: Self-pay | Admitting: Family Medicine

## 2014-04-23 MED ORDER — CANAGLIFLOZIN 100 MG PO TABS: 100.0000 mg | ORAL_TABLET | Freq: Every day | ORAL | Status: DC

## 2014-04-23 NOTE — Telephone Encounter (Signed)
[facility] was [facility]

## 2014-04-23 NOTE — Telephone Encounter (Signed)
[facility]. This medication is expensive and he has found a coupon that'll cover it, but has to have this sent over to an actual pharmacy, not mail order, to fill.  And please have them hold this medicine, because he is not quite needing a refill yet.

## 2014-05-28 ENCOUNTER — Telehealth: Payer: Self-pay | Admitting: Family Medicine

## 2014-05-28 DIAGNOSIS — I10 Essential (primary) hypertension: Secondary | ICD-10-CM

## 2014-05-28 DIAGNOSIS — E785 Hyperlipidemia, unspecified: Secondary | ICD-10-CM

## 2014-05-28 DIAGNOSIS — E119 Type 2 diabetes mellitus without complications: Secondary | ICD-10-CM

## 2014-05-28 DIAGNOSIS — Z79899 Other long term (current) drug therapy: Secondary | ICD-10-CM

## 2014-05-28 DIAGNOSIS — Z125 Encounter for screening for malignant neoplasm of prostate: Secondary | ICD-10-CM

## 2014-05-28 NOTE — Telephone Encounter (Signed)
bloodwork orders ready. Pt notified on voicemail.  

## 2014-05-28 NOTE — Telephone Encounter (Signed)
11/20/13 - lipid, liver, a1c

## 2014-05-28 NOTE — Telephone Encounter (Signed)
Does patient need BW for upcoming diabetic check? °

## 2014-05-28 NOTE — Telephone Encounter (Signed)
Lip liv Aic M7 psa

## 2014-05-31 LAB — BASIC METABOLIC PANEL
BUN: 16 mg/dL (ref 6–23)
CHLORIDE: 103 meq/L (ref 96–112)
CO2: 27 mEq/L (ref 19–32)
Calcium: 9.3 mg/dL (ref 8.4–10.5)
Creat: 0.86 mg/dL (ref 0.50–1.35)
Glucose, Bld: 141 mg/dL — ABNORMAL HIGH (ref 70–99)
POTASSIUM: 4.8 meq/L (ref 3.5–5.3)
SODIUM: 139 meq/L (ref 135–145)

## 2014-05-31 LAB — LIPID PANEL
CHOL/HDL RATIO: 3.6 ratio
Cholesterol: 157 mg/dL (ref 0–200)
HDL: 44 mg/dL (ref >39)
LDL Cholesterol: 90 mg/dL (ref 0–99)
Triglycerides: 114 mg/dL (ref <150)
VLDL: 23 mg/dL (ref 0–40)

## 2014-05-31 LAB — HEPATIC FUNCTION PANEL
ALBUMIN: 4.3 g/dL (ref 3.5–5.2)
ALT: 50 U/L (ref 0–53)
AST: 33 U/L (ref 0–37)
Alkaline Phosphatase: 50 U/L (ref 39–117)
BILIRUBIN TOTAL: 1 mg/dL (ref 0.2–1.2)
Bilirubin, Direct: 0.2 mg/dL (ref 0.0–0.3)
Indirect Bilirubin: 0.8 mg/dL (ref 0.2–1.2)
TOTAL PROTEIN: 7 g/dL (ref 6.0–8.3)

## 2014-05-31 LAB — HEMOGLOBIN A1C
HEMOGLOBIN A1C: 7.1 % — AB (ref <5.7)
Mean Plasma Glucose: 157 mg/dL — ABNORMAL HIGH (ref <117)

## 2014-06-01 LAB — PSA: PSA: 0.31 ng/mL (ref <=4.00)

## 2014-06-05 ENCOUNTER — Encounter: Payer: Self-pay | Admitting: Family Medicine

## 2014-06-06 ENCOUNTER — Encounter: Payer: Self-pay | Admitting: Family Medicine

## 2014-06-07 ENCOUNTER — Encounter: Payer: Self-pay | Admitting: Family Medicine

## 2014-06-07 ENCOUNTER — Ambulatory Visit (INDEPENDENT_AMBULATORY_CARE_PROVIDER_SITE_OTHER): Payer: BLUE CROSS/BLUE SHIELD | Admitting: Family Medicine

## 2014-06-07 VITALS — BP 124/82 | Ht 68.5 in | Wt 160.4 lb

## 2014-06-07 DIAGNOSIS — E785 Hyperlipidemia, unspecified: Secondary | ICD-10-CM

## 2014-06-07 DIAGNOSIS — E1140 Type 2 diabetes mellitus with diabetic neuropathy, unspecified: Secondary | ICD-10-CM

## 2014-06-07 DIAGNOSIS — I10 Essential (primary) hypertension: Secondary | ICD-10-CM

## 2014-06-07 NOTE — Progress Notes (Signed)
   Subjective:    Patient ID: Victor Gonzales, male    DOB: October 31, 1960, 54 y.o.   MRN: [id number]  Diabetes He presents for his follow-up diabetic visit. He has type 2 diabetes mellitus. His disease course has been stable. There are no hypoglycemic associated symptoms. There are no diabetic associated symptoms. There are no hypoglycemic complications. Symptoms are stable. There are no diabetic complications. There are no known risk factors for coronary artery disease. Current diabetic treatment includes oral agent (dual therapy). He is compliant with treatment all of the time.   Patient had bloodwork (A1C) done and results are in the system.   BP good when cked elsewhere  Patient states that he his right eye stays watery all the time.   Patient states that he has stopped taking the nighttime dose of Amaryl because it was causing his blood sugar to drop to low. Results for orders placed or performed in visit on 05/28/14  Lipid panel  Result Value Ref Range   Cholesterol 157 0 - 200 mg/dL   Triglycerides 782114 <956<150 mg/dL   HDL 44 >21>39 mg/dL   Total CHOL/HDL Ratio 3.6 Ratio   VLDL 23 0 - 40 mg/dL   LDL Cholesterol 90 0 - 99 mg/dL  Hepatic function panel  Result Value Ref Range   Total Bilirubin 1.0 0.2 - 1.2 mg/dL   Bilirubin, Direct 0.2 0.0 - 0.3 mg/dL   Indirect Bilirubin 0.8 0.2 - 1.2 mg/dL   Alkaline Phosphatase 50 39 - 117 U/L   AST 33 0 - 37 U/L   ALT 50 0 - 53 U/L   Total Protein 7.0 6.0 - 8.3 g/dL   Albumin 4.3 3.5 - 5.2 g/dL  Hemoglobin H0QA1c  Result Value Ref Range   Hgb A1c MFr Bld 7.1 (H) <5.7 %   Mean Plasma Glucose 157 (H) <117 mg/dL  Basic metabolic panel  Result Value Ref Range   Sodium 139 135 - 145 mEq/L   Potassium 4.8 3.5 - 5.3 mEq/L   Chloride 103 96 - 112 mEq/L   CO2 27 19 - 32 mEq/L   Glucose, Bld 141 (H) 70 - 99 mg/dL   BUN 16 6 - 23 mg/dL   Creat 6.570.86 8.460.50 - 9.621.35 mg/dL   Calcium 9.3 8.4 - 95.210.5 mg/dL  PSA  Result Value Ref Range   PSA 0.31 <=4.00 ng/mL   C below for further history and assessment and plan  Review of Systems No headache no chest pain no back pain abdominal pain no change in bowel habits    Objective:   Physical Exam  Alert vital signs stable blood pressure good on repeat HEENT eyes bilateral normal this time neck supple. Lungs clear heart regular in rhythm. C diabetic foot exam      Assessment & Plan:  He is compliant with blood pressure medication. No obvious side effects from this. Has cut salt intake down.  Notes numbness in feet seems to improve. Still somewhat in left side. See below.  Impression type 2 diabetes good control discussed significant improvement. #2 hypertension good control discussed #3 sensory neuropathy improved #4 right eye tearing likely partial tear.dysfunction discussed plan maintain same medications. Diet discussed exercise discussed. Meds refilled. Follow-up as scheduled. No further intervention with eye at this time. WSL

## 2014-07-09 ENCOUNTER — Other Ambulatory Visit: Payer: Self-pay | Admitting: Family Medicine

## 2014-09-02 ENCOUNTER — Other Ambulatory Visit: Payer: Self-pay | Admitting: Family Medicine

## 2014-09-12 LAB — HM DIABETES EYE EXAM

## 2014-09-19 ENCOUNTER — Other Ambulatory Visit: Payer: Self-pay | Admitting: Family Medicine

## 2014-09-28 ENCOUNTER — Encounter: Payer: Self-pay | Admitting: *Deleted

## 2014-11-19 ENCOUNTER — Other Ambulatory Visit: Payer: Self-pay

## 2014-12-04 ENCOUNTER — Ambulatory Visit (INDEPENDENT_AMBULATORY_CARE_PROVIDER_SITE_OTHER): Payer: BLUE CROSS/BLUE SHIELD | Admitting: Family Medicine

## 2014-12-04 ENCOUNTER — Encounter: Payer: Self-pay | Admitting: Family Medicine

## 2014-12-04 VITALS — BP 132/82 | Ht 68.5 in | Wt 162.4 lb

## 2014-12-04 DIAGNOSIS — F411 Generalized anxiety disorder: Secondary | ICD-10-CM

## 2014-12-04 DIAGNOSIS — E785 Hyperlipidemia, unspecified: Secondary | ICD-10-CM | POA: Diagnosis not present

## 2014-12-04 DIAGNOSIS — G4700 Insomnia, unspecified: Secondary | ICD-10-CM

## 2014-12-04 DIAGNOSIS — E119 Type 2 diabetes mellitus without complications: Secondary | ICD-10-CM | POA: Diagnosis not present

## 2014-12-04 DIAGNOSIS — I10 Essential (primary) hypertension: Secondary | ICD-10-CM

## 2014-12-04 LAB — POCT GLYCOSYLATED HEMOGLOBIN (HGB A1C): Hemoglobin A1C: 7.1

## 2014-12-04 MED ORDER — METFORMIN HCL 500 MG PO TABS: 1000.0000 mg | ORAL_TABLET | Freq: Two times a day (BID) | ORAL | Status: DC

## 2014-12-04 MED ORDER — BISOPROLOL-HYDROCHLOROTHIAZIDE 10-6.25 MG PO TABS: 1.0000 | ORAL_TABLET | Freq: Every day | ORAL | Status: DC

## 2014-12-04 MED ORDER — RAMIPRIL 10 MG PO CAPS: 10.0000 mg | ORAL_CAPSULE | Freq: Every day | ORAL | Status: DC

## 2014-12-04 MED ORDER — AMLODIPINE BESYLATE 2.5 MG PO TABS: 2.5000 mg | ORAL_TABLET | Freq: Every day | ORAL | Status: DC

## 2014-12-04 MED ORDER — GLIMEPIRIDE 4 MG PO TABS
4.0000 mg | ORAL_TABLET | Freq: Every day | ORAL | Status: DC
Start: 2014-12-04 — End: 2015-07-03

## 2014-12-04 MED ORDER — CANAGLIFLOZIN 100 MG PO TABS: 100.0000 mg | ORAL_TABLET | Freq: Every day | ORAL | Status: DC

## 2014-12-04 NOTE — Progress Notes (Signed)
   Subjective:    Patient ID: Victor Gonzales, male    DOB: Aug 03, 1960, 54 y.o.   MRN: [id number]  Diabetes He presents for his follow-up diabetic visit. There are no hypoglycemic associated symptoms. There are no diabetic associated symptoms. There are no hypoglycemic complications. There are no diabetic complications. There are no known risk factors for coronary artery disease. Current diabetic treatment includes oral agent (triple therapy). He is compliant with treatment all of the time.   Patient has no concerns at this time.   Last diabetic eye exam 09/12/14. No retinopathy.   Results for orders placed or performed in visit on 12/04/14  POCT glycosylated hemoglobin (Hb A1C)  Result Value Ref Range   Hemoglobin A1C 7.1     morn numbers generally 104 or 113  Patient compliant with blood pressure medication. No obvious side effects. Has cut down her salt intake.  Patient exercising some but not a lot.   Patient reports his insomnia overall has improved considerably. One episode of nocturia per night.   Patient reports anxiety is stable   Review of Systems No headache no cough no chest pain no abdominal pain no change in bowel habits    Objective:   Physical Exam Alert vital stable no acute distress HEENT normal. Blood pressure good on repeat. Lungs clear. Heart regular rate and rhythm. Ankles without edema       Assessment & Plan:  Impression 1 type 2 diabetes good control discussed #2 hypertension good control discussed plan regular compliance of medications discussed. Recheck in 6 months. Wellness exam plus diabetes visit then. Medications refilled WSL

## 2015-02-21 ENCOUNTER — Encounter: Payer: Self-pay | Admitting: Family Medicine

## 2015-02-21 ENCOUNTER — Ambulatory Visit (INDEPENDENT_AMBULATORY_CARE_PROVIDER_SITE_OTHER): Payer: BLUE CROSS/BLUE SHIELD | Admitting: Nurse Practitioner

## 2015-02-21 ENCOUNTER — Encounter: Payer: Self-pay | Admitting: Nurse Practitioner

## 2015-02-21 VITALS — BP 122/70 | Temp 97.7°F | Ht 68.5 in | Wt 158.0 lb

## 2015-02-21 DIAGNOSIS — J069 Acute upper respiratory infection, unspecified: Secondary | ICD-10-CM

## 2015-02-21 DIAGNOSIS — B9689 Other specified bacterial agents as the cause of diseases classified elsewhere: Secondary | ICD-10-CM

## 2015-02-21 MED ORDER — AZITHROMYCIN 250 MG PO TABS: ORAL_TABLET | ORAL | Status: DC

## 2015-02-21 NOTE — Progress Notes (Signed)
Subjective:  Presents complaints of fatigue and congestion that began 5 days ago. Started off feeling very tired with slight sore throat slight headache runny nose minimal cough. Fatigue and symptoms improved until yesterday, patient started feeling bad again. No fevers. Sore throat has resolved. Producing clear mucus. No wheezing. No ear pain. Several people at work have also been sick. Blood sugars at home have been stable.  Objective:   BP 122/70 mmHg  Temp(Src) 97.7 F (36.5 C) (Oral)  Ht 5' 8.5" (1.74 m)  Wt 158 lb (71.668 kg)  BMI 23.67 kg/m2 NAD. Alert, oriented. Right TM clear effusion. Left TM retracted, no erythema. Pharynx mildly erythematous with green PND noted. Neck supple with mild soft anterior adenopathy. Lungs clear. Heart regular rate rhythm.  Assessment: Bacterial upper respiratory infection  Plan:  Meds ordered this encounter  Medications  . azithromycin (ZITHROMAX Z-PAK) 250 MG tablet    Sig: Take 2 tablets (500 mg) on  Day 1,  followed by 1 tablet (250 mg) once daily on Days 2 through 5.    Dispense:  6 each    Refill:  0    Order Specific Question:  Supervising Provider    Answer:  Merlyn Albert [[id number]]   OTC meds as directed for congestion and cough. Avoid decongestants. Warning signs reviewed. Call back next week if no improvement, sooner if worse.

## 2015-04-17 ENCOUNTER — Telehealth: Payer: Self-pay | Admitting: Family Medicine

## 2015-04-17 NOTE — Telephone Encounter (Signed)
Discussed with pt appt on Friday. Go to  [facility] if trouble breathing or swallowing.

## 2015-04-17 NOTE — Telephone Encounter (Signed)
Patient has had about 2 weeks of getting food stuck in his throat and its causing him to vomit.  Wife states it is just like he cannot get it to go down.  She wants to know if we can work him in anytime soon as late in the afternoon as possible.

## 2015-04-19 ENCOUNTER — Ambulatory Visit: Payer: BLUE CROSS/BLUE SHIELD | Admitting: Family Medicine

## 2015-06-06 ENCOUNTER — Encounter: Payer: BLUE CROSS/BLUE SHIELD | Admitting: Family Medicine

## 2015-07-03 ENCOUNTER — Other Ambulatory Visit: Payer: Self-pay

## 2015-07-03 MED ORDER — RAMIPRIL 10 MG PO CAPS: 10.0000 mg | ORAL_CAPSULE | Freq: Every day | ORAL | Status: DC

## 2015-07-03 MED ORDER — BISOPROLOL-HYDROCHLOROTHIAZIDE 10-6.25 MG PO TABS: 1.0000 | ORAL_TABLET | Freq: Every day | ORAL | Status: DC

## 2015-07-03 MED ORDER — GLIMEPIRIDE 4 MG PO TABS: 4.0000 mg | ORAL_TABLET | Freq: Every day | ORAL | Status: DC

## 2015-07-03 MED ORDER — CANAGLIFLOZIN 100 MG PO TABS: 100.0000 mg | ORAL_TABLET | Freq: Every day | ORAL | Status: DC

## 2015-07-03 MED ORDER — METFORMIN HCL 500 MG PO TABS: 1000.0000 mg | ORAL_TABLET | Freq: Two times a day (BID) | ORAL | Status: DC

## 2015-07-03 MED ORDER — AMLODIPINE BESYLATE 2.5 MG PO TABS: 2.5000 mg | ORAL_TABLET | Freq: Every day | ORAL | Status: DC

## 2015-07-04 ENCOUNTER — Other Ambulatory Visit: Payer: Self-pay | Admitting: *Deleted

## 2015-07-04 MED ORDER — METFORMIN HCL 500 MG PO TABS: 1000.0000 mg | ORAL_TABLET | Freq: Two times a day (BID) | ORAL | Status: DC

## 2015-07-04 MED ORDER — BISOPROLOL-HYDROCHLOROTHIAZIDE 10-6.25 MG PO TABS: 1.0000 | ORAL_TABLET | Freq: Every day | ORAL | Status: DC

## 2015-07-04 MED ORDER — AMLODIPINE BESYLATE 2.5 MG PO TABS: 2.5000 mg | ORAL_TABLET | Freq: Every day | ORAL | Status: DC

## 2015-07-04 MED ORDER — RAMIPRIL 10 MG PO CAPS: 10.0000 mg | ORAL_CAPSULE | Freq: Every day | ORAL | Status: DC

## 2015-07-04 MED ORDER — CANAGLIFLOZIN 100 MG PO TABS: 100.0000 mg | ORAL_TABLET | Freq: Every day | ORAL | Status: DC

## 2015-07-04 MED ORDER — GLIMEPIRIDE 4 MG PO TABS: 4.0000 mg | ORAL_TABLET | Freq: Every day | ORAL | Status: DC

## 2015-07-09 ENCOUNTER — Ambulatory Visit (INDEPENDENT_AMBULATORY_CARE_PROVIDER_SITE_OTHER): Payer: BLUE CROSS/BLUE SHIELD | Admitting: Family Medicine

## 2015-07-09 ENCOUNTER — Encounter: Payer: Self-pay | Admitting: Family Medicine

## 2015-07-09 VITALS — BP 148/96 | Ht 67.0 in | Wt 158.0 lb

## 2015-07-09 DIAGNOSIS — E119 Type 2 diabetes mellitus without complications: Secondary | ICD-10-CM | POA: Diagnosis not present

## 2015-07-09 DIAGNOSIS — R5383 Other fatigue: Secondary | ICD-10-CM | POA: Diagnosis not present

## 2015-07-09 DIAGNOSIS — Z125 Encounter for screening for malignant neoplasm of prostate: Secondary | ICD-10-CM | POA: Diagnosis not present

## 2015-07-09 DIAGNOSIS — Z79899 Other long term (current) drug therapy: Secondary | ICD-10-CM

## 2015-07-09 DIAGNOSIS — E785 Hyperlipidemia, unspecified: Secondary | ICD-10-CM | POA: Diagnosis not present

## 2015-07-09 DIAGNOSIS — Z0000 Encounter for general adult medical examination without abnormal findings: Secondary | ICD-10-CM

## 2015-07-09 LAB — POCT GLYCOSYLATED HEMOGLOBIN (HGB A1C): Hemoglobin A1C: 7.9

## 2015-07-09 MED ORDER — RAMIPRIL 10 MG PO CAPS: ORAL_CAPSULE | ORAL | Status: DC

## 2015-07-09 MED ORDER — GLIMEPIRIDE 4 MG PO TABS: ORAL_TABLET | ORAL | Status: DC

## 2015-07-09 MED ORDER — CANAGLIFLOZIN 100 MG PO TABS
100.0000 mg | ORAL_TABLET | Freq: Every day | ORAL | Status: DC
Start: 2015-07-09 — End: 2015-07-11

## 2015-07-09 NOTE — Progress Notes (Signed)
   Subjective:    Patient ID: Victor Gonzales, male    DOB: Jul 10, 1960, 55 y.o.   MRN: [id number]  HPI The patient comes in today for a wellness visit.  Colonoscopy April 2015.   A review of their health history was completed.  A review of medications was also completed.  Any needed refills; Invokana  Eating habits: diabetic diet  Falls/  MVA accidents in past few months: none  Regular exercise: walks three times a week, for about a mile , also very active at work   Specialist pt sees on regular basis: none  Preventative health issues were discussed.   Additional concerns: none  A1C today 7.9, claims compliance with meds, no obv side effects, decent hyper appetite  Patient claims compliance with blood pressure medicine. Does not miss doses. Watching salt intake. Not checked much elsewhere    Review of Systems  Constitutional: Negative for fever, activity change and appetite change.  HENT: Negative for congestion and rhinorrhea.   Eyes: Negative for discharge.  Respiratory: Negative for cough and wheezing.   Cardiovascular: Negative for chest pain.  Gastrointestinal: Negative for vomiting, abdominal pain and blood in stool.  Genitourinary: Negative for frequency and difficulty urinating.  Musculoskeletal: Negative for neck pain.  Skin: Negative for rash.  Allergic/Immunologic: Negative for environmental allergies and food allergies.  Neurological: Negative for weakness and headaches.  Psychiatric/Behavioral: Negative for agitation.       Objective:   Physical Exam  Constitutional: He appears well-developed and well-nourished.  HENT:  Head: Normocephalic and atraumatic.  Right Ear: External ear normal.  Left Ear: External ear normal.  Nose: Nose normal.  Mouth/Throat: Oropharynx is clear and moist.  Eyes: EOM are normal. Pupils are equal, round, and reactive to light.  Neck: Normal range of motion. Neck supple. No thyromegaly present.  Cardiovascular: Normal rate,  regular rhythm and normal heart sounds.   No murmur heard. Pulmonary/Chest: Effort normal and breath sounds normal. No respiratory distress. He has no wheezes.  Abdominal: Soft. Bowel sounds are normal. He exhibits no distension and no mass. There is no tenderness.  Genitourinary: Penis normal.  Prostate mild enlargement no nodules normal consistency  Musculoskeletal: Normal range of motion. He exhibits no edema.  Lymphadenopathy:    He has no cervical adenopathy.  Neurological: He is alert. He exhibits normal muscle tone.  Skin: Skin is warm and dry. No erythema.  Psychiatric: He has a normal mood and affect. His behavior is normal. Judgment normal.  Vitals reviewed.         Assessment & Plan:  Imp 1 wellness exam, colon utd, screening b w disc 2 subopt type two diab, time to incr amaryl disc 3htn, subopt control time to incr altace disc P diet, exercise, bw disc, meds ref, re ck six mp, diab eye exams encour

## 2015-07-11 ENCOUNTER — Telehealth: Payer: Self-pay | Admitting: Family Medicine

## 2015-07-11 MED ORDER — CANAGLIFLOZIN 100 MG PO TABS: 100.0000 mg | ORAL_TABLET | Freq: Every day | ORAL | Status: DC

## 2015-07-11 NOTE — Telephone Encounter (Signed)
Rx sent electronically to pharmacy. Patient notified. 

## 2015-07-11 NOTE — Telephone Encounter (Signed)
Pt is needing his [facility] sent to [facility] [place].

## 2015-07-13 ENCOUNTER — Other Ambulatory Visit: Payer: Self-pay | Admitting: Family Medicine

## 2015-07-13 LAB — CBC WITH DIFFERENTIAL/PLATELET
BASOS ABS: 0 10*3/uL (ref 0.0–0.2)
Basos: 1 %
EOS (ABSOLUTE): 0.4 10*3/uL (ref 0.0–0.4)
EOS: 7 %
HEMATOCRIT: 51.5 % — AB (ref 37.5–51.0)
HEMOGLOBIN: 17.5 g/dL (ref 12.6–17.7)
Immature Grans (Abs): 0 10*3/uL (ref 0.0–0.1)
Immature Granulocytes: 0 %
LYMPHS ABS: 2.2 10*3/uL (ref 0.7–3.1)
Lymphs: 35 %
MCH: 29.2 pg (ref 26.6–33.0)
MCHC: 34 g/dL (ref 31.5–35.7)
MCV: 86 fL (ref 79–97)
MONOCYTES: 9 %
MONOS ABS: 0.5 10*3/uL (ref 0.1–0.9)
NEUTROS ABS: 3 10*3/uL (ref 1.4–7.0)
Neutrophils: 48 %
Platelets: 163 10*3/uL (ref 150–379)
RBC: 6 x10E6/uL — AB (ref 4.14–5.80)
RDW: 13.7 % (ref 12.3–15.4)
WBC: 6.2 10*3/uL (ref 3.4–10.8)

## 2015-07-13 LAB — HEPATIC FUNCTION PANEL
ALBUMIN: 4.8 g/dL (ref 3.5–5.5)
ALT: 54 IU/L — ABNORMAL HIGH (ref 0–44)
AST: 39 IU/L (ref 0–40)
Alkaline Phosphatase: 65 IU/L (ref 39–117)
BILIRUBIN TOTAL: 1 mg/dL (ref 0.0–1.2)
BILIRUBIN, DIRECT: 0.26 mg/dL (ref 0.00–0.40)
TOTAL PROTEIN: 7.1 g/dL (ref 6.0–8.5)

## 2015-07-13 LAB — BASIC METABOLIC PANEL
BUN / CREAT RATIO: 19 (ref 9–20)
BUN: 17 mg/dL (ref 6–24)
CHLORIDE: 101 mmol/L (ref 96–106)
CO2: 23 mmol/L (ref 18–29)
CREATININE: 0.91 mg/dL (ref 0.76–1.27)
Calcium: 9.6 mg/dL (ref 8.7–10.2)
GFR calc Af Amer: 109 mL/min/{1.73_m2} (ref >59)
GFR calc non Af Amer: 95 mL/min/{1.73_m2} (ref >59)
Glucose: 157 mg/dL — ABNORMAL HIGH (ref 65–99)
Potassium: 5.1 mmol/L (ref 3.5–5.2)
Sodium: 142 mmol/L (ref 134–144)

## 2015-07-13 LAB — FERRITIN: Ferritin: 289 ng/mL (ref 30–400)

## 2015-07-13 LAB — MICROALBUMIN / CREATININE URINE RATIO
CREATININE, UR: 91.9 mg/dL
MICROALB/CREAT RATIO: 10.7 mg/g{creat} (ref 0.0–30.0)
Microalbumin, Urine: 9.8 ug/mL

## 2015-07-13 LAB — LIPID PANEL
CHOL/HDL RATIO: 3.1 ratio (ref 0.0–5.0)
Cholesterol, Total: 164 mg/dL (ref 100–199)
HDL: 53 mg/dL (ref >39)
LDL Calculated: 90 mg/dL (ref 0–99)
Triglycerides: 107 mg/dL (ref 0–149)
VLDL Cholesterol Cal: 21 mg/dL (ref 5–40)

## 2015-07-13 LAB — PSA: PROSTATE SPECIFIC AG, SERUM: 0.3 ng/mL (ref 0.0–4.0)

## 2015-07-14 ENCOUNTER — Encounter: Payer: Self-pay | Admitting: Family Medicine

## 2015-08-01 ENCOUNTER — Ambulatory Visit (INDEPENDENT_AMBULATORY_CARE_PROVIDER_SITE_OTHER): Payer: BLUE CROSS/BLUE SHIELD | Admitting: Family Medicine

## 2015-08-01 ENCOUNTER — Encounter: Payer: Self-pay | Admitting: Family Medicine

## 2015-08-01 VITALS — BP 130/94 | Temp 97.9°F | Ht 67.0 in | Wt 160.0 lb

## 2015-08-01 DIAGNOSIS — Z029 Encounter for administrative examinations, unspecified: Secondary | ICD-10-CM

## 2015-08-01 DIAGNOSIS — S20211A Contusion of right front wall of thorax, initial encounter: Secondary | ICD-10-CM | POA: Diagnosis not present

## 2015-08-01 NOTE — Progress Notes (Signed)
   Subjective:    Patient ID: Victor Gonzales, male    DOB: 04-03-1961, 55 y.o.   MRN: [id number]  Fall The accident occurred more than 1 week ago. The fall occurred from a ladder. He fell from a height of 11 to 15 ft. Pain location: ribs  The pain is moderate. He has tried nothing for the symptoms. The treatment provided no relief.   Patient needs FMLA papers completed for his employer also.  The patient states that he's missed a couple days from work over the past few weeks 1 due to gastroenteritis another one due to falling off a ladder bruising is chest wall unable to work after that. Since then he has returned to work. Unfortunately with his workplace if he does not fill out FMLA he has a chance of losing his job  He also has diabetes for which she seen several times per year and also needs FMLA filled out for that.  Review of Systems Patient denies wheezing shortness of breath high fever chills nausea vomiting diarrhea does relate pain and discomfort with rotation and extension in the ribs    Objective:   Physical Exam  His lungs are clear no crackles heart is regular the abdomen is soft extremities no edema he does have bruising noted on the right side the chest with some mild to moderate tenderness      Assessment & Plan:  It is possible that he may have sustained a light fracture of the ribs but I talk with the patient at length the patient and I come to the agreement that doing a chest x-ray would probably be of little to no value and rib x-rays if it does show a small fracture there is not much that can be done for it he has decided to just live with this and use ibuprofen when necessary  I did talk with him about recent lab work that he received a letter about I told him it's very important to eat healthy exercise try to lose weight and to repeat liver enzymes later this year along with his other standard lab work.

## 2015-08-11 ENCOUNTER — Other Ambulatory Visit: Payer: Self-pay | Admitting: Family Medicine

## 2015-09-16 ENCOUNTER — Telehealth: Payer: Self-pay | Admitting: Family Medicine

## 2015-09-16 NOTE — Telephone Encounter (Signed)
Patient's insurance is denying his invokana prescription, and he is having to pay over $600 out of pocket.  Lynnwood's spouse states that the pharmacy told her a prior authorization has been completed, but we need to call the insurance company and do an appeal so his copay can be dropped to 0.  Please advise.   [facility]

## 2015-09-16 NOTE — Telephone Encounter (Signed)
Pt needs it plz initiate appeal

## 2015-09-17 NOTE — Telephone Encounter (Signed)
[facility]

## 2015-09-17 NOTE — Telephone Encounter (Signed)
Left a very detailed message on machine for pt & wife - explained that an appeal is not needed.    Basically this patient is choosing to use retail pharmacy v/s mail order  The prescription is not being denied due to needing a prior authorization  I called the prescription discount card people, I called [facility] twice, and [facility]  Patient is trying to refill early, next fill due 10/09/15 according to [facility], however this does not mean that a prior auth is needed  If the patient chooses to refill Rx at a local retail pharmacy, the copay is higher.  [facility] - claim for Rx went through (no rejection for any PA need) and the prescription discount card (coupon) went through leaving the patient a $238 copay  Pt's wife states that the coupon card folks for Invokana told her to have her PCP call to do an appeal for patient to be able to get Rx filled at his local retail pharmacy for $0 copay, I have exhausted all options and there's nothing more I can do to appeal this when a denial is not in place.  Rx is covered with no PA and patient choosing to use retail pharmacy is going to cost him more out of pocket.    Also left on message that if patient wishes to try a different, cheaper medicine, he will need to discuss with Dr. Brett CanalesSteve.  (I spent a total of about 3 hours on this with phone calls, discussing with each rep at insurance & coupon card issuer & pharmacy & with pt's wife before message was left on their machine)

## 2015-09-17 NOTE — Telephone Encounter (Signed)
There is no cheaper alternative, any further discussion on this matter will require a face to face visit with me, sorry it took three hours research to determine this info, that stinks

## 2015-09-17 NOTE — Telephone Encounter (Signed)
Notified wife there is no cheaper alternative, any further discussion on this matter will require a face to face visit with Dr. Brett CanalesSteve. Wife verbalized understanding.

## 2015-11-05 DIAGNOSIS — M25774 Osteophyte, right foot: Secondary | ICD-10-CM | POA: Diagnosis not present

## 2015-11-05 DIAGNOSIS — M79671 Pain in right foot: Secondary | ICD-10-CM | POA: Diagnosis not present

## 2015-11-05 DIAGNOSIS — M25579 Pain in unspecified ankle and joints of unspecified foot: Secondary | ICD-10-CM | POA: Diagnosis not present

## 2015-11-12 DIAGNOSIS — E119 Type 2 diabetes mellitus without complications: Secondary | ICD-10-CM | POA: Diagnosis not present

## 2015-11-12 DIAGNOSIS — Z1389 Encounter for screening for other disorder: Secondary | ICD-10-CM | POA: Diagnosis not present

## 2015-11-12 DIAGNOSIS — I10 Essential (primary) hypertension: Secondary | ICD-10-CM | POA: Diagnosis not present

## 2015-11-12 DIAGNOSIS — Z008 Encounter for other general examination: Secondary | ICD-10-CM | POA: Diagnosis not present

## 2015-11-16 ENCOUNTER — Other Ambulatory Visit: Payer: Self-pay | Admitting: Family Medicine

## 2015-12-24 DIAGNOSIS — M25579 Pain in unspecified ankle and joints of unspecified foot: Secondary | ICD-10-CM | POA: Diagnosis not present

## 2015-12-24 DIAGNOSIS — M25775 Osteophyte, left foot: Secondary | ICD-10-CM | POA: Diagnosis not present

## 2015-12-24 DIAGNOSIS — M79672 Pain in left foot: Secondary | ICD-10-CM | POA: Diagnosis not present

## 2015-12-31 ENCOUNTER — Other Ambulatory Visit: Payer: Self-pay | Admitting: Family Medicine

## 2016-01-01 ENCOUNTER — Other Ambulatory Visit: Payer: Self-pay | Admitting: Family Medicine

## 2016-01-07 ENCOUNTER — Encounter: Payer: Self-pay | Admitting: Family Medicine

## 2016-01-07 ENCOUNTER — Ambulatory Visit (INDEPENDENT_AMBULATORY_CARE_PROVIDER_SITE_OTHER): Payer: BLUE CROSS/BLUE SHIELD | Admitting: Family Medicine

## 2016-01-07 VITALS — BP 128/80 | Ht 67.0 in | Wt 160.5 lb

## 2016-01-07 DIAGNOSIS — E119 Type 2 diabetes mellitus without complications: Secondary | ICD-10-CM | POA: Diagnosis not present

## 2016-01-07 DIAGNOSIS — I10 Essential (primary) hypertension: Secondary | ICD-10-CM | POA: Diagnosis not present

## 2016-01-07 LAB — POCT GLYCOSYLATED HEMOGLOBIN (HGB A1C): HEMOGLOBIN A1C: 8

## 2016-01-07 MED ORDER — CANAGLIFLOZIN 100 MG PO TABS: ORAL_TABLET | ORAL | 1 refills | Status: DC

## 2016-01-07 MED ORDER — GLIMEPIRIDE 4 MG PO TABS: ORAL_TABLET | ORAL | 1 refills | Status: DC

## 2016-01-07 MED ORDER — METFORMIN HCL 500 MG PO TABS: ORAL_TABLET | ORAL | 1 refills | Status: DC

## 2016-01-07 MED ORDER — AMLODIPINE BESYLATE 2.5 MG PO TABS: 2.5000 mg | ORAL_TABLET | Freq: Every day | ORAL | 1 refills | Status: DC

## 2016-01-07 MED ORDER — RAMIPRIL 10 MG PO CAPS: 20.0000 mg | ORAL_CAPSULE | Freq: Every day | ORAL | 5 refills | Status: DC

## 2016-01-07 MED ORDER — BISOPROLOL-HYDROCHLOROTHIAZIDE 10-6.25 MG PO TABS: 1.0000 | ORAL_TABLET | Freq: Every day | ORAL | 1 refills | Status: DC

## 2016-01-07 NOTE — Progress Notes (Addendum)
   Subjective:    Patient ID: Victor Gonzales, male    DOB: Jun 05, 1960, 55 y.o.   MRN: [id number]  Diabetes  He presents for his follow-up diabetic visit. He has type 2 diabetes mellitus. No MedicAlert identification noted. He has not had a previous visit with a dietitian. He sees a Risk analystpodiatrist (Dr.Tucker).Eye exam is not current.   Blood pressure medicine and blood pressure levels reviewed today with patient. Compliant with blood pressure medicine. States does not miss a dose. No obvious side effects. Blood pressure generally good when checked elsewhere. Watching salt intake.  Patient claims compliance with diabetes medication. No obvious side effects. Reports no substantial low sugar spells. Most numbers are generally in good range when checked fasting. Generally does not miss a dose of medication. Watching diabetic diet closely  Patient has concerns of low blood sugars during the night.  Results for orders placed or performed in visit on 01/07/16  POCT HgB A1C  Result Value Ref Range   Hemoglobin A1C 8.0      Review of Systems No headache, no major weight loss or weight gain, no chest pain no back pain abdominal pain no change in bowel habits complete ROS otherwise negative     Objective:   Physical Exam   Alert vitals stable, NAD. Blood pressure good on repeat. HEENT normal. Lungs clear. Heart regular rate and rhythm.      Assessment & Plan:  Impression #1 type 2 diabetes still suboptimum discuss #2 hypertension good control discussed plan increase Invega conative to 100 mg tablets daily maintain other medications follow-up as scheduled

## 2016-01-21 DIAGNOSIS — Z008 Encounter for other general examination: Secondary | ICD-10-CM | POA: Diagnosis not present

## 2016-01-21 DIAGNOSIS — Z1389 Encounter for screening for other disorder: Secondary | ICD-10-CM | POA: Diagnosis not present

## 2016-01-21 DIAGNOSIS — I10 Essential (primary) hypertension: Secondary | ICD-10-CM | POA: Diagnosis not present

## 2016-01-21 DIAGNOSIS — E119 Type 2 diabetes mellitus without complications: Secondary | ICD-10-CM | POA: Diagnosis not present

## 2016-03-04 DIAGNOSIS — Z23 Encounter for immunization: Secondary | ICD-10-CM | POA: Diagnosis not present

## 2016-03-23 DIAGNOSIS — M25579 Pain in unspecified ankle and joints of unspecified foot: Secondary | ICD-10-CM | POA: Diagnosis not present

## 2016-03-23 DIAGNOSIS — M79672 Pain in left foot: Secondary | ICD-10-CM | POA: Diagnosis not present

## 2016-03-23 DIAGNOSIS — M25775 Osteophyte, left foot: Secondary | ICD-10-CM | POA: Diagnosis not present

## 2016-04-21 DIAGNOSIS — Z008 Encounter for other general examination: Secondary | ICD-10-CM | POA: Diagnosis not present

## 2016-04-21 DIAGNOSIS — E119 Type 2 diabetes mellitus without complications: Secondary | ICD-10-CM | POA: Diagnosis not present

## 2016-04-21 DIAGNOSIS — I10 Essential (primary) hypertension: Secondary | ICD-10-CM | POA: Diagnosis not present

## 2016-06-22 ENCOUNTER — Other Ambulatory Visit: Payer: Self-pay | Admitting: Family Medicine

## 2016-07-09 ENCOUNTER — Encounter: Payer: BLUE CROSS/BLUE SHIELD | Admitting: Family Medicine

## 2016-07-28 DIAGNOSIS — Z008 Encounter for other general examination: Secondary | ICD-10-CM | POA: Diagnosis not present

## 2016-07-28 DIAGNOSIS — Z1389 Encounter for screening for other disorder: Secondary | ICD-10-CM | POA: Diagnosis not present

## 2016-07-28 DIAGNOSIS — Z719 Counseling, unspecified: Secondary | ICD-10-CM | POA: Diagnosis not present

## 2016-07-28 DIAGNOSIS — E119 Type 2 diabetes mellitus without complications: Secondary | ICD-10-CM | POA: Diagnosis not present

## 2016-07-28 DIAGNOSIS — I10 Essential (primary) hypertension: Secondary | ICD-10-CM | POA: Diagnosis not present

## 2016-08-06 ENCOUNTER — Encounter: Payer: Self-pay | Admitting: Family Medicine

## 2016-08-06 ENCOUNTER — Ambulatory Visit (INDEPENDENT_AMBULATORY_CARE_PROVIDER_SITE_OTHER): Payer: BLUE CROSS/BLUE SHIELD | Admitting: Family Medicine

## 2016-08-06 VITALS — BP 182/100 | Ht 67.0 in | Wt 159.0 lb

## 2016-08-06 DIAGNOSIS — K580 Irritable bowel syndrome with diarrhea: Secondary | ICD-10-CM

## 2016-08-06 DIAGNOSIS — E119 Type 2 diabetes mellitus without complications: Secondary | ICD-10-CM | POA: Diagnosis not present

## 2016-08-06 DIAGNOSIS — I10 Essential (primary) hypertension: Secondary | ICD-10-CM

## 2016-08-06 LAB — POCT GLYCOSYLATED HEMOGLOBIN (HGB A1C): HEMOGLOBIN A1C: 8.1

## 2016-08-06 MED ORDER — RAMIPRIL 10 MG PO CAPS: 20.0000 mg | ORAL_CAPSULE | Freq: Every day | ORAL | 1 refills | Status: DC

## 2016-08-06 MED ORDER — BISOPROLOL-HYDROCHLOROTHIAZIDE 10-6.25 MG PO TABS: 1.0000 | ORAL_TABLET | Freq: Every day | ORAL | 1 refills | Status: DC

## 2016-08-06 MED ORDER — CANAGLIFLOZIN 100 MG PO TABS: 200.0000 mg | ORAL_TABLET | Freq: Every day | ORAL | 1 refills | Status: DC

## 2016-08-06 MED ORDER — METFORMIN HCL 500 MG PO TABS: ORAL_TABLET | ORAL | 1 refills | Status: DC

## 2016-08-06 MED ORDER — AMLODIPINE BESYLATE 5 MG PO TABS: 5.0000 mg | ORAL_TABLET | Freq: Every day | ORAL | 1 refills | Status: DC

## 2016-08-06 MED ORDER — GLIMEPIRIDE 4 MG PO TABS: 8.0000 mg | ORAL_TABLET | Freq: Every day | ORAL | 1 refills | Status: DC

## 2016-08-06 NOTE — Progress Notes (Signed)
   Subjective:    Patient ID: Victor Gonzales, male    DOB: May 04, 1961, 56 y.o.   MRN: [id number] Patient arrives office with numerous issues. Hypertension  This is a chronic problem. The current episode started more than 1 year ago. Associated symptoms include blurred vision and headaches. (Ringing in ears) Treatments tried: ramipril, amlodipine. There are no compliance problems.    Blood pressure medicine and blood pressure levels reviewed today with patient. Compliant with blood pressure medicine. States does not miss a dose. No obvious side effects. Blood pressure generally good when checked elsewhere. Watching salt intake.   BP high last night and today. 190/100, 194/100 taken by RN at work.   Eating is fair but not great  Certain foods like fish and fried foods, leads to mid abdominal discomfort. Not severe but aggravating  Stomach turns, sometimes assos with loose stools  Hx of reflux nd hiatal hernia   Hx of neg colon  Patient claims compliance with diabetes medication. No obvious side effects. Reports no substantial low sugar spells. Most numbers are generally in good range when checked fasting. Generally does not miss a dose of medication. Watching diabetic diet closely  On further history usually skipping evening diabetes meds   Review of Systems  Eyes: Positive for blurred vision.  Neurological: Positive for headaches.       Objective:   Physical Exam Alert and oriented, vitals reviewed and stable, NAD ENT-TM's and ext canals WNL bilat via otoscopic exam Soft palate, tonsils and post pharynx WNL via oropharyngeal exam Neck-symmetric, no masses; thyroid nonpalpable and nontender Pulmonary-no tachypnea or accessory muscle use; Clear without wheezes via auscultation Card--no abnrml murmurs, rhythm reg and rate WNL Carotid pulses symmetric, without bruits  Results for orders placed or performed in visit on 08/06/16  POCT glycosylated hemoglobin (Hb A1C)  Result  Value Ref Range   Hemoglobin A1C 8.1          Assessment & Plan:  Impression 1 hypertension suboptimal control. Need to increase Norvasc. Rationale discussed. #2 type 2 diabetes. A1c elevated. Noncompliance is the evening meds. Long discussion held. Patient willing to work harder on bump medication #3 probable IBS discussed at length offered meds patient to think about it. Diet exercise discussed. Schedule both follow-up wellness and chronic visit

## 2016-08-18 DIAGNOSIS — I10 Essential (primary) hypertension: Secondary | ICD-10-CM | POA: Diagnosis not present

## 2016-08-24 DIAGNOSIS — M79671 Pain in right foot: Secondary | ICD-10-CM | POA: Diagnosis not present

## 2016-08-24 DIAGNOSIS — M79672 Pain in left foot: Secondary | ICD-10-CM | POA: Diagnosis not present

## 2016-08-24 DIAGNOSIS — M25579 Pain in unspecified ankle and joints of unspecified foot: Secondary | ICD-10-CM | POA: Diagnosis not present

## 2016-10-27 DIAGNOSIS — Z008 Encounter for other general examination: Secondary | ICD-10-CM | POA: Diagnosis not present

## 2016-10-27 DIAGNOSIS — I10 Essential (primary) hypertension: Secondary | ICD-10-CM | POA: Diagnosis not present

## 2016-10-27 DIAGNOSIS — Z719 Counseling, unspecified: Secondary | ICD-10-CM | POA: Diagnosis not present

## 2016-10-27 DIAGNOSIS — E119 Type 2 diabetes mellitus without complications: Secondary | ICD-10-CM | POA: Diagnosis not present

## 2016-11-02 ENCOUNTER — Ambulatory Visit (INDEPENDENT_AMBULATORY_CARE_PROVIDER_SITE_OTHER): Payer: BLUE CROSS/BLUE SHIELD | Admitting: Family Medicine

## 2016-11-02 ENCOUNTER — Encounter: Payer: Self-pay | Admitting: Family Medicine

## 2016-11-02 VITALS — BP 158/98 | Temp 98.2°F | Ht 67.0 in | Wt 158.0 lb

## 2016-11-02 DIAGNOSIS — J329 Chronic sinusitis, unspecified: Secondary | ICD-10-CM | POA: Diagnosis not present

## 2016-11-02 DIAGNOSIS — J310 Chronic rhinitis: Secondary | ICD-10-CM

## 2016-11-02 MED ORDER — DOXYCYCLINE HYCLATE 100 MG PO TABS
100.0000 mg | ORAL_TABLET | Freq: Two times a day (BID) | ORAL | 0 refills | Status: DC
Start: 2016-11-02 — End: 2017-02-24

## 2016-11-02 NOTE — Patient Instructions (Signed)
Ibuprofen good dose for significant headache is 600 mg three times per day, this equals three 200 mg otc tab three times per day

## 2016-11-02 NOTE — Progress Notes (Signed)
   Subjective:    Patient ID: Victor SpearingAlvin V Pokorney, male    DOB: November 07, 1960, 56 y.o.   MRN: [id number]  Sinusitis  This is a new problem. The current episode started yesterday. Associated symptoms include coughing, headaches and sinus pressure.   Started sun morn, got to feeong bad  xevere headache, frontal region ,    litle cough and cong  Pos yellow gunkiness  No fever  No sore throat  Some runnisnose   Some dranage   Headache fairly severe at times frontal region primarily. No rash. No fever  Review of Systems  HENT: Positive for sinus pressure.   Respiratory: Positive for cough.   Neurological: Positive for headaches.       Objective:   Physical Exam Alert, mild malaise. Hydration good Vitals stable. frontal/ maxillary tenderness evident positive nasal congestion. pharynx normal neck supple  lungs clear/no crackles or wheezes. heart regular in rhythm        Assessment & Plan:  Impression rhinosinusitis likely post viral, discussed with patient. plan antibiotics prescribed. Questions answered. Symptomatic care discussed. warning signs discussed. WSL To be unsafe side will cover for tickborne illness also the known history of tick bite no fever

## 2016-11-30 ENCOUNTER — Other Ambulatory Visit: Payer: Self-pay | Admitting: Family Medicine

## 2017-01-01 ENCOUNTER — Other Ambulatory Visit: Payer: Self-pay | Admitting: Family Medicine

## 2017-01-23 ENCOUNTER — Other Ambulatory Visit: Payer: Self-pay | Admitting: Family Medicine

## 2017-01-26 DIAGNOSIS — Z008 Encounter for other general examination: Secondary | ICD-10-CM | POA: Diagnosis not present

## 2017-01-26 DIAGNOSIS — Z719 Counseling, unspecified: Secondary | ICD-10-CM | POA: Diagnosis not present

## 2017-01-26 DIAGNOSIS — E119 Type 2 diabetes mellitus without complications: Secondary | ICD-10-CM | POA: Diagnosis not present

## 2017-01-26 DIAGNOSIS — Z139 Encounter for screening, unspecified: Secondary | ICD-10-CM | POA: Diagnosis not present

## 2017-01-26 DIAGNOSIS — Z7189 Other specified counseling: Secondary | ICD-10-CM | POA: Diagnosis not present

## 2017-01-26 DIAGNOSIS — I10 Essential (primary) hypertension: Secondary | ICD-10-CM | POA: Diagnosis not present

## 2017-02-02 ENCOUNTER — Telehealth: Payer: Self-pay | Admitting: Family Medicine

## 2017-02-02 DIAGNOSIS — I10 Essential (primary) hypertension: Secondary | ICD-10-CM | POA: Diagnosis not present

## 2017-02-02 DIAGNOSIS — R718 Other abnormality of red blood cells: Secondary | ICD-10-CM | POA: Diagnosis not present

## 2017-02-02 DIAGNOSIS — E119 Type 2 diabetes mellitus without complications: Secondary | ICD-10-CM | POA: Diagnosis not present

## 2017-02-02 NOTE — Telephone Encounter (Signed)
Review blood work results in results folder. °

## 2017-02-08 ENCOUNTER — Encounter: Payer: BLUE CROSS/BLUE SHIELD | Admitting: Family Medicine

## 2017-02-14 ENCOUNTER — Other Ambulatory Visit: Payer: Self-pay | Admitting: Family Medicine

## 2017-02-24 ENCOUNTER — Encounter: Payer: Self-pay | Admitting: Family Medicine

## 2017-02-24 ENCOUNTER — Ambulatory Visit (INDEPENDENT_AMBULATORY_CARE_PROVIDER_SITE_OTHER): Payer: BLUE CROSS/BLUE SHIELD | Admitting: Family Medicine

## 2017-02-24 ENCOUNTER — Telehealth: Payer: Self-pay | Admitting: Family Medicine

## 2017-02-24 VITALS — BP 168/94 | HR 69 | Temp 98.0°F | Ht 67.0 in | Wt 162.0 lb

## 2017-02-24 DIAGNOSIS — I10 Essential (primary) hypertension: Secondary | ICD-10-CM | POA: Diagnosis not present

## 2017-02-24 DIAGNOSIS — L03116 Cellulitis of left lower limb: Secondary | ICD-10-CM | POA: Diagnosis not present

## 2017-02-24 MED ORDER — DOXYCYCLINE HYCLATE 100 MG PO TABS: 100.0000 mg | ORAL_TABLET | Freq: Two times a day (BID) | ORAL | 0 refills | Status: DC

## 2017-02-24 NOTE — Telephone Encounter (Signed)
Prescription sent electronically to [facility] pharmacy as requested.

## 2017-02-24 NOTE — Progress Notes (Signed)
   Subjective:    Patient ID: Shella Spearing, male    DOB: Jan 30, 1961, 56 y.o.   MRN: [id number]  Hypertension    Patient comes in today complaining that he woke up with headache this am. He checked his bp at home and it was 166/96. Takes Amlodipine 5 mg one daily. He did take it this am.Orthostatcis done.  Pt had elevated blood pressure this morn  166 over 87  188 over 101  Pt hurt leg and got infected and started neosporin  Review of Systems No headache, no major weight loss or weight gain, no chest pain no back pain abdominal pain no change in bowel habits complete ROS otherwise negative     Objective:   Physical Exam Alert vitals stable, NAD. Blood pressure good on repeat. HEENT normal. Lungs clear. Heart regular rate and rhythm. Left anterior leg discretmatous patch with ulceration tender to palpation       Assessment & Plan:  Impression elevated blood pressure with headache/potential sinusitis/left leg soft tissue infection plan antibiotics prescribed symptom care discussed warning signs discussed. Follow-up regular appointment if blood pressure still elevated and may need to do further

## 2017-02-24 NOTE — Telephone Encounter (Signed)
Patient had Rx for doxycycline called in today to [facility].  He is needing this sent to [facility].

## 2017-02-25 ENCOUNTER — Other Ambulatory Visit: Payer: Self-pay | Admitting: Family Medicine

## 2017-03-09 ENCOUNTER — Encounter: Payer: Self-pay | Admitting: Family Medicine

## 2017-03-09 ENCOUNTER — Ambulatory Visit (INDEPENDENT_AMBULATORY_CARE_PROVIDER_SITE_OTHER): Payer: BLUE CROSS/BLUE SHIELD | Admitting: Family Medicine

## 2017-03-09 VITALS — BP 140/88 | Ht 67.0 in | Wt 162.0 lb

## 2017-03-09 DIAGNOSIS — E119 Type 2 diabetes mellitus without complications: Secondary | ICD-10-CM | POA: Diagnosis not present

## 2017-03-09 DIAGNOSIS — I10 Essential (primary) hypertension: Secondary | ICD-10-CM | POA: Diagnosis not present

## 2017-03-09 DIAGNOSIS — E1140 Type 2 diabetes mellitus with diabetic neuropathy, unspecified: Secondary | ICD-10-CM | POA: Diagnosis not present

## 2017-03-09 DIAGNOSIS — Z0000 Encounter for general adult medical examination without abnormal findings: Secondary | ICD-10-CM

## 2017-03-09 MED ORDER — BISOPROLOL-HYDROCHLOROTHIAZIDE 10-6.25 MG PO TABS: 1.0000 | ORAL_TABLET | Freq: Every day | ORAL | 1 refills | Status: DC

## 2017-03-09 MED ORDER — GLIMEPIRIDE 4 MG PO TABS: 8.0000 mg | ORAL_TABLET | Freq: Every day | ORAL | 1 refills | Status: DC

## 2017-03-09 MED ORDER — AMLODIPINE BESYLATE 5 MG PO TABS: 5.0000 mg | ORAL_TABLET | Freq: Every day | ORAL | 1 refills | Status: DC

## 2017-03-09 MED ORDER — DAPAGLIFLOZIN PROPANEDIOL 10 MG PO TABS: 10.0000 mg | ORAL_TABLET | Freq: Every day | ORAL | 1 refills | Status: DC

## 2017-03-09 MED ORDER — METFORMIN HCL 500 MG PO TABS: ORAL_TABLET | ORAL | 1 refills | Status: DC

## 2017-03-09 NOTE — Progress Notes (Signed)
Subjective:    Patient ID: Victor Gonzales, male    DOB: [birth year], 56 y.o.   MRN: [id number]  HPI The patient comes in today for a wellness visit.    A review of their health history was completed.  A review of medications was also completed.  Any needed refills; none  Eating habits: health conscious  Falls/  MVA accidents in past few months: none  Regular exercise: walking  Specialist pt sees on regular basis: none  Preventative health issues were discussed.   Additional concerns: blood pressure. Wants to switch from Invokana to something.   Pt wonders about invokana   Pt states he had bloodwork at work in September. States A1C was 7.0.   Results for orders placed or performed in visit on 08/06/16  POCT glycosylated hemoglobin (Hb A1C)  Result Value Ref Range   Hemoglobin A1C 8.1    Blood pressure medicine and blood pressure levels reviewed today with patient. Compliant with blood pressure medicine. States does not miss a dose. No obvious side effects. Blood pressure generally good when checked elsewhere. Watching salt intake.   Patient claims compliance with diabetes medication. No obvious side effects. Reports no substantial low sugar spells. Most numbers are generally in good range when checked fasting. Generally does not miss a dose of medication. Watching diabetic diet closely    Review of Systems  Constitutional: Negative for activity change, appetite change and fever.  HENT: Negative for congestion and rhinorrhea.   Eyes: Negative for discharge.  Respiratory: Negative for cough and wheezing.   Cardiovascular: Negative for chest pain.  Gastrointestinal: Negative for abdominal pain, blood in stool and vomiting.  Genitourinary: Negative for difficulty urinating and frequency.  Musculoskeletal: Negative for neck pain.  Skin: Negative for rash.  Allergic/Immunologic: Negative for environmental allergies and food allergies.  Neurological: Negative for weakness  and headaches.  Psychiatric/Behavioral: Negative for agitation.  All other systems reviewed and are negative.      Objective:   Physical Exam  Constitutional: He appears well-developed and well-nourished.  HENT:  Head: Normocephalic and atraumatic.  Right Ear: External ear normal.  Left Ear: External ear normal.  Nose: Nose normal.  Mouth/Throat: Oropharynx is clear and moist.  Eyes: Pupils are equal, round, and reactive to light. EOM are normal.  Neck: Normal range of motion. Neck supple. No thyromegaly present.  Cardiovascular: Normal rate, regular rhythm and normal heart sounds.   No murmur heard. Pulmonary/Chest: Effort normal and breath sounds normal. No respiratory distress. He has no wheezes.  Abdominal: Soft. Bowel sounds are normal. He exhibits no distension and no mass. There is no tenderness.  Genitourinary: Penis normal.  Genitourinary Comments: Prostate exam within normal limits  Musculoskeletal: Normal range of motion. He exhibits no edema.  Lymphadenopathy:    He has no cervical adenopathy.  Neurological: He is alert. He exhibits normal muscle tone.  Skin: Skin is warm and dry. No erythema.  Psychiatric: He has a normal mood and affect. His behavior is normal. Judgment normal.  Vitals reviewed.         Assessment & Plan:  Impression 1 wellness exam. Diet exercise discussed. Up-to-date on colonoscopy. Vaccinations discussed.Encouraged to get regular eye doctor visit  #2 type 2 diabetes control neuropathy discussed.a1c overall improved. Patient would like to stop taking invoke on. Due to potential side effects. I think this is reasonable, will change to [place]  #3 hypertension suboptimal control discussed patient would like to work harder on diet and exercise before  increasing meds. Flu shot [facility] given

## 2017-03-20 ENCOUNTER — Other Ambulatory Visit: Payer: Self-pay | Admitting: Family Medicine

## 2017-03-23 ENCOUNTER — Other Ambulatory Visit: Payer: Self-pay | Admitting: *Deleted

## 2017-03-23 MED ORDER — RAMIPRIL 10 MG PO CAPS: 20.0000 mg | ORAL_CAPSULE | Freq: Every day | ORAL | 1 refills | Status: DC

## 2017-04-14 ENCOUNTER — Encounter: Payer: Self-pay | Admitting: Family Medicine

## 2017-04-14 ENCOUNTER — Ambulatory Visit: Payer: BLUE CROSS/BLUE SHIELD | Admitting: Family Medicine

## 2017-04-14 VITALS — BP 142/90 | Temp 98.6°F | Ht 67.0 in | Wt 155.0 lb

## 2017-04-14 DIAGNOSIS — K2900 Acute gastritis without bleeding: Secondary | ICD-10-CM | POA: Diagnosis not present

## 2017-04-14 MED ORDER — RANITIDINE HCL 300 MG PO TABS: 300.0000 mg | ORAL_TABLET | Freq: Every day | ORAL | 0 refills | Status: DC

## 2017-04-14 MED ORDER — PANTOPRAZOLE SODIUM 40 MG PO TBEC: 40.0000 mg | DELAYED_RELEASE_TABLET | Freq: Every day | ORAL | 0 refills | Status: DC

## 2017-04-14 MED ORDER — DAPAGLIFLOZIN PROPANEDIOL 10 MG PO TABS: 10.0000 mg | ORAL_TABLET | Freq: Every day | ORAL | 1 refills | Status: DC

## 2017-04-14 NOTE — Progress Notes (Signed)
   Subjective:    Patient ID: Victor Gonzales, male    DOB: September 19, 1960, 56 y.o.   MRN: [id number]  Abdominal Pain  This is a new problem. Episode onset: 3 and a half days ago. The pain is located in the epigastric region. The quality of the pain is cramping and sharp. Associated symptoms include anorexia. Associated symptoms comments: Burning in throat a couple of times. Relieved by: lying down, sitting still. Treatments tried: pepto.   Sat eve upper mid abd pain  seeed to be worse with laying on side  Pain off and on sharp at times  diminshed appetite  Not as hungry with the appain  Worked Monday  Little pain not much  Felt achey at times  Worse at night   No fever no nause no chill  Yrs ago had hiatal hernia, and had an ultrasoound, took som meds and got better   occass ibuprofen  Non smoker  No alcohol   sundrop plenty of    Review of Systems  Gastrointestinal: Positive for abdominal pain and anorexia.       Objective:   Physical Exam Alert and oriented, vitals reviewed and stable, NAD ENT-TM's and ext canals WNL bilat via otoscopic exam Soft palate, tonsils and post pharynx WNL via oropharyngeal exam Neck-symmetric, no masses; thyroid nonpalpable and nontender Pulmonary-no tachypnea or accessory muscle use; Clear without wheezes via auscultation Card--no abnrml murmurs, rhythm reg and rate WNL Carotid pulses symmetric, without bruits Abdomen distinct epigastric tenderness.  No rebound no guarding Bowel sounds no organomegaly  Appropriate American heart aspirin calculator utilized and patient does qualify for low-dose aspirin    Assessment & Plan:  Impression abdominal pain.  Long discussion held.  Hold off on further studies at this time.  Trial of medication appropriate for gastritis.  After the calm down recommend switching from pro-proton pump inhibitor down to histamine blocker rationale discussed/warning signs discussed  Greater than 50% of this 25  minute face to face visit was spent in counseling and discussion and coordination of care regarding the above diagnosis/diagnosies

## 2017-05-19 ENCOUNTER — Telehealth: Payer: Self-pay

## 2017-05-19 NOTE — Telephone Encounter (Signed)
Pt was taken off of Invokana 100 mg two po QD and put on Farxiga 10 mg Daily. They are now saying they would like to be changed back to the Invokana as the Marcelline DeistFarxiga is too expensive. Please advise. They have enough medication (Invokana) as of now. ([facility]) Please advise. Thanks,CS

## 2017-05-23 NOTE — Telephone Encounter (Signed)
okswitch back to invokana per pt request

## 2017-05-24 ENCOUNTER — Other Ambulatory Visit: Payer: Self-pay

## 2017-05-24 MED ORDER — CANAGLIFLOZIN 100 MG PO TABS: 200.0000 mg | ORAL_TABLET | Freq: Every day | ORAL | 1 refills | Status: DC

## 2017-05-24 NOTE — Telephone Encounter (Signed)
I sent in the new Rx and cancelled the [place]. Left message with the to confirm dosage and drugstore is [facility].

## 2017-05-24 NOTE — Addendum Note (Signed)
Addended by: Meredith LeedsSUTTON, CRYSTAL L on: 05/24/2017 08:29 AM   Modules accepted: Orders

## 2017-05-27 NOTE — Telephone Encounter (Signed)
I called and left a detailed message that we have sent in new rx.No need to call unless has questions,if so to call our office.

## 2017-06-07 ENCOUNTER — Other Ambulatory Visit: Payer: Self-pay | Admitting: Family Medicine

## 2017-06-25 ENCOUNTER — Encounter: Payer: Self-pay | Admitting: Family Medicine

## 2017-06-25 DIAGNOSIS — E113291 Type 2 diabetes mellitus with mild nonproliferative diabetic retinopathy without macular edema, right eye: Secondary | ICD-10-CM | POA: Diagnosis not present

## 2017-06-25 DIAGNOSIS — E119 Type 2 diabetes mellitus without complications: Secondary | ICD-10-CM | POA: Diagnosis not present

## 2017-06-25 LAB — HM DIABETES EYE EXAM

## 2017-07-12 LAB — HM DIABETES EYE EXAM

## 2017-07-21 ENCOUNTER — Other Ambulatory Visit: Payer: Self-pay | Admitting: Family Medicine

## 2017-08-10 DIAGNOSIS — Z719 Counseling, unspecified: Secondary | ICD-10-CM | POA: Diagnosis not present

## 2017-08-10 DIAGNOSIS — Z1389 Encounter for screening for other disorder: Secondary | ICD-10-CM | POA: Diagnosis not present

## 2017-08-10 DIAGNOSIS — Z008 Encounter for other general examination: Secondary | ICD-10-CM | POA: Diagnosis not present

## 2017-08-10 DIAGNOSIS — E119 Type 2 diabetes mellitus without complications: Secondary | ICD-10-CM | POA: Diagnosis not present

## 2017-08-10 DIAGNOSIS — I10 Essential (primary) hypertension: Secondary | ICD-10-CM | POA: Diagnosis not present

## 2017-08-31 ENCOUNTER — Other Ambulatory Visit: Payer: Self-pay | Admitting: Family Medicine

## 2017-09-08 DIAGNOSIS — Z0289 Encounter for other administrative examinations: Secondary | ICD-10-CM

## 2017-09-09 ENCOUNTER — Encounter: Payer: Self-pay | Admitting: Family Medicine

## 2017-09-09 ENCOUNTER — Ambulatory Visit: Payer: BLUE CROSS/BLUE SHIELD | Admitting: Family Medicine

## 2017-09-09 VITALS — BP 140/84 | Ht 67.0 in | Wt 159.4 lb

## 2017-09-09 DIAGNOSIS — I10 Essential (primary) hypertension: Secondary | ICD-10-CM

## 2017-09-09 DIAGNOSIS — I878 Other specified disorders of veins: Secondary | ICD-10-CM

## 2017-09-09 DIAGNOSIS — E1140 Type 2 diabetes mellitus with diabetic neuropathy, unspecified: Secondary | ICD-10-CM

## 2017-09-09 LAB — POCT GLYCOSYLATED HEMOGLOBIN (HGB A1C): Hemoglobin A1C: 6.7

## 2017-09-09 MED ORDER — BISOPROLOL-HYDROCHLOROTHIAZIDE 10-6.25 MG PO TABS: 1.0000 | ORAL_TABLET | Freq: Every day | ORAL | 1 refills | Status: DC

## 2017-09-09 MED ORDER — METFORMIN HCL 500 MG PO TABS: ORAL_TABLET | ORAL | 1 refills | Status: DC

## 2017-09-09 MED ORDER — AMLODIPINE BESYLATE 5 MG PO TABS: 5.0000 mg | ORAL_TABLET | Freq: Every day | ORAL | 1 refills | Status: DC

## 2017-09-09 MED ORDER — RAMIPRIL 10 MG PO CAPS: 20.0000 mg | ORAL_CAPSULE | Freq: Every day | ORAL | 1 refills | Status: DC

## 2017-09-09 MED ORDER — PANTOPRAZOLE SODIUM 40 MG PO TBEC: 40.0000 mg | DELAYED_RELEASE_TABLET | Freq: Every day | ORAL | 0 refills | Status: DC

## 2017-09-09 MED ORDER — GLIMEPIRIDE 4 MG PO TABS: 8.0000 mg | ORAL_TABLET | Freq: Every day | ORAL | 1 refills | Status: DC

## 2017-09-09 MED ORDER — CANAGLIFLOZIN 100 MG PO TABS: 200.0000 mg | ORAL_TABLET | Freq: Every day | ORAL | 1 refills | Status: DC

## 2017-09-09 MED ORDER — RANITIDINE HCL 300 MG PO TABS: 300.0000 mg | ORAL_TABLET | Freq: Every day | ORAL | 1 refills | Status: DC

## 2017-09-09 NOTE — Progress Notes (Signed)
   Subjective:    Patient ID: Victor SpearingAlvin V Nicholl, male    DOB: [birth year], 57 y.o.   MRN: [id number]  Diabetes  He presents for his follow-up diabetic visit. He has type 2 diabetes mellitus. Risk factors for coronary artery disease include diabetes mellitus, hypertension and dyslipidemia. Current diabetic treatment includes oral agent (dual therapy). His weight is stable. He is following a diabetic diet. He has not had a previous visit with a dietitian. He does not see a podiatrist.Eye exam is current.    Patient claims compliance with diabetes medication. No obvious side effects. Reports no substantial low sugar spells. Most numbers are generally in good range when checked fasting. Generally does not miss a dose of medication. Watching diabetic diet closely  Blood pressure medicine and blood pressure levels reviewed today with patient. Compliant with blood pressure medicine. States does not miss a dose. No obvious side effects. Blood pressure generally good when checked elsewhere. Watching salt intake.     Results for orders placed or performed in visit on 09/09/17  POCT glycosylated hemoglobin (Hb A1C)  Result Value Ref Range   Hemoglobin A1C 6.7   HM DIABETES EYE EXAM  Result Value Ref Range   HM Diabetic Eye Exam No Retinopathy No Retinopathy   Reflux generally stable on medications.  No obvious side effects tolerating well  Insomnia still an issue.  However prefers to go with over-the-counter agents discussed    Review of Systems No headache, no major weight loss or weight gain, no chest pain no back pain abdominal pain no change in bowel habits complete ROS otherwise negative     Objective:   Physical Exam Alert and oriented, vitals reviewed and stable, NAD ENT-TM's and ext canals WNL bilat via otoscopic exam Soft palate, tonsils and post pharynx WNL via oropharyngeal exam Neck-symmetric, no masses; thyroid nonpalpable and nontender Pulmonary-no tachypnea or accessory muscle  use; Clear without wheezes via auscultation Card--no abnrml murmurs, rhythm reg and rate WNL Carotid pulses symmetric, without bruits Ankles positive changes of chronic venous stasis noted       Assessment & Plan:  #1 left ankle greater than right chronic bilateral venous stasis local measures discussed  2.  Type 2 diabetes good control discussed to maintain same meds compliance discussed  3.  Hypertension good control discussed maintain same meds  4.  New recommendations regarding lipid medications discussed patient declines for now  Blood work reviewed diet exercise discussed appropriate medications refilled follow-up in 6 months  Greater than 50% of this 25 minute face to face visit was spent in counseling and discussion and coordination of care regarding the above diagnosis/diagnosies

## 2017-09-15 ENCOUNTER — Telehealth: Payer: Self-pay | Admitting: Family Medicine

## 2017-09-15 NOTE — Telephone Encounter (Signed)
Patient brought in FMLA with his visit on 4/18.Please review form and fill in where needed,date,sign.Form is in your yellow folder.

## 2017-11-16 DIAGNOSIS — E119 Type 2 diabetes mellitus without complications: Secondary | ICD-10-CM | POA: Diagnosis not present

## 2017-11-16 DIAGNOSIS — Z008 Encounter for other general examination: Secondary | ICD-10-CM | POA: Diagnosis not present

## 2017-11-16 DIAGNOSIS — I10 Essential (primary) hypertension: Secondary | ICD-10-CM | POA: Diagnosis not present

## 2017-11-16 DIAGNOSIS — Z719 Counseling, unspecified: Secondary | ICD-10-CM | POA: Diagnosis not present

## 2018-01-18 LAB — HM DIABETES EYE EXAM

## 2018-02-01 DIAGNOSIS — Z139 Encounter for screening, unspecified: Secondary | ICD-10-CM | POA: Diagnosis not present

## 2018-02-01 DIAGNOSIS — E119 Type 2 diabetes mellitus without complications: Secondary | ICD-10-CM | POA: Diagnosis not present

## 2018-02-01 DIAGNOSIS — Z0130 Encounter for examination of blood pressure without abnormal findings: Secondary | ICD-10-CM | POA: Diagnosis not present

## 2018-02-01 DIAGNOSIS — Z79899 Other long term (current) drug therapy: Secondary | ICD-10-CM | POA: Diagnosis not present

## 2018-02-15 DIAGNOSIS — I10 Essential (primary) hypertension: Secondary | ICD-10-CM | POA: Diagnosis not present

## 2018-02-15 DIAGNOSIS — Z719 Counseling, unspecified: Secondary | ICD-10-CM | POA: Diagnosis not present

## 2018-02-15 DIAGNOSIS — E119 Type 2 diabetes mellitus without complications: Secondary | ICD-10-CM | POA: Diagnosis not present

## 2018-02-15 DIAGNOSIS — Z008 Encounter for other general examination: Secondary | ICD-10-CM | POA: Diagnosis not present

## 2018-02-17 ENCOUNTER — Other Ambulatory Visit: Payer: Self-pay | Admitting: Family Medicine

## 2018-02-22 DIAGNOSIS — Z23 Encounter for immunization: Secondary | ICD-10-CM | POA: Diagnosis not present

## 2018-03-08 ENCOUNTER — Ambulatory Visit: Payer: BLUE CROSS/BLUE SHIELD | Admitting: Family Medicine

## 2018-03-08 ENCOUNTER — Encounter: Payer: Self-pay | Admitting: Family Medicine

## 2018-03-08 VITALS — BP 150/92 | Ht 67.0 in | Wt 158.6 lb

## 2018-03-08 DIAGNOSIS — G44209 Tension-type headache, unspecified, not intractable: Secondary | ICD-10-CM

## 2018-03-08 DIAGNOSIS — I10 Essential (primary) hypertension: Secondary | ICD-10-CM | POA: Diagnosis not present

## 2018-03-08 MED ORDER — AMLODIPINE BESYLATE 10 MG PO TABS: 10.0000 mg | ORAL_TABLET | Freq: Every day | ORAL | 1 refills | Status: DC

## 2018-03-08 MED ORDER — CEFDINIR 300 MG PO CAPS: 300.0000 mg | ORAL_CAPSULE | Freq: Two times a day (BID) | ORAL | 0 refills | Status: DC

## 2018-03-08 NOTE — Progress Notes (Signed)
   Subjective:    Patient ID: Victor Gonzales, male    DOB: [birth year], 57 y.o.   MRN: [id number]  Headache   The current episode started 1 to 4 weeks ago. The problem occurs intermittently. Radiates to: neck. Quality: pounding. Associated symptoms include coughing. He has tried acetaminophen and NSAIDs for the symptoms. The treatment provided mild relief.  Pt states BP has been up. Pt states he checks his BP one or two times a day.   HA started two or three weeks ago  fet deep and aching and uncomfrotable  Of and on over the past severa weeks  No ajor h a hx   contd to work  h a feels very ba at times, takes advil and ibu and tyl two advil today    occas dry cough   173 pv 95  rcent weeks bp often high ,  n   Takes b p meds faithfully         Review of Systems  Respiratory: Positive for cough.   Neurological: Positive for headaches.       Objective:   Physical Exam  Alert active good hydration.  HEENT mild nasal congestion TMs normal.  Extraocular motions intact.  Funduscopic exam normal pharynx normal neck supple.  Lungs clear.  Heart rate and rhythm.  Impression 1 headaches.  Potentially secondary to #2 or #3 patient taking 2 Advil as needed.  Encouraged him to increase to 3  2.  Hypertension's control discussed need to increase rationale discussed  3.  Potential rhinosinusitis.  Will cover with antibiotics.  Follow-up in 2 weeks for chronic visit plus follow-up visit plus blood work reviewed  Greater than 50% of this 25 minute face to face visit was spent in counseling and discussion and coordination of care regarding the above diagnosis/diagnosies       Assessment & Plan:

## 2018-03-09 ENCOUNTER — Telehealth: Payer: Self-pay | Admitting: Family Medicine

## 2018-03-09 NOTE — Telephone Encounter (Signed)
Left message for pt to return call. Does pt want to cancel rx at mail order?

## 2018-03-09 NOTE — Telephone Encounter (Signed)
Pt seen yesterday and was prescribed  amLODipine (NORVASC) 10 MG tablet  Pt needs listed medication to be resent to correct pharmacy of  [id number] - [place], [place] - [address] Advise.

## 2018-03-10 ENCOUNTER — Other Ambulatory Visit: Payer: Self-pay | Admitting: *Deleted

## 2018-03-10 ENCOUNTER — Encounter: Payer: BLUE CROSS/BLUE SHIELD | Admitting: Family Medicine

## 2018-03-10 MED ORDER — AMLODIPINE BESYLATE 10 MG PO TABS: 10.0000 mg | ORAL_TABLET | Freq: Every day | ORAL | 1 refills | Status: DC

## 2018-03-10 MED ORDER — AMOXICILLIN 500 MG PO CAPS: 500.0000 mg | ORAL_CAPSULE | Freq: Three times a day (TID) | ORAL | 0 refills | Status: DC

## 2018-03-10 MED ORDER — AMLODIPINE BESYLATE 10 MG PO TABS: 10.0000 mg | ORAL_TABLET | Freq: Every day | ORAL | 0 refills | Status: DC

## 2018-03-10 NOTE — Telephone Encounter (Signed)
Discussed with pt. Pt verbalized understanding.amoxil sent to pharm.

## 2018-03-10 NOTE — Telephone Encounter (Signed)
Called mailed order to cancel rx

## 2018-03-10 NOTE — Telephone Encounter (Signed)
I would recommend amoxicillin 500 mg 1 3 times daily for 10 days This is the best tolerated medicine Stop the other antibiotic He may also use a probiotic every day-OTC

## 2018-03-10 NOTE — Telephone Encounter (Signed)
Pt put on omnicef 300mg  2 days ago and states its causing diarrhea and would like changed. Explained to pt all antibiotics can cause diarrhea but he states its really bad and he cannot not deal with it.

## 2018-03-10 NOTE — Telephone Encounter (Signed)
Pt wanted 30 day to local pharm and 90 day to mail order. Refills sent to both pharm as pt requested

## 2018-03-10 NOTE — Telephone Encounter (Signed)
Med sent to [facility]. Left message to return call

## 2018-03-30 ENCOUNTER — Encounter: Payer: Self-pay | Admitting: Family Medicine

## 2018-03-30 ENCOUNTER — Ambulatory Visit (INDEPENDENT_AMBULATORY_CARE_PROVIDER_SITE_OTHER): Payer: BLUE CROSS/BLUE SHIELD | Admitting: Family Medicine

## 2018-03-30 VITALS — BP 136/88 | Ht 66.0 in | Wt 155.8 lb

## 2018-03-30 DIAGNOSIS — E1140 Type 2 diabetes mellitus with diabetic neuropathy, unspecified: Secondary | ICD-10-CM | POA: Diagnosis not present

## 2018-03-30 DIAGNOSIS — Z0000 Encounter for general adult medical examination without abnormal findings: Secondary | ICD-10-CM

## 2018-03-30 DIAGNOSIS — I10 Essential (primary) hypertension: Secondary | ICD-10-CM

## 2018-03-30 LAB — POCT GLYCOSYLATED HEMOGLOBIN (HGB A1C): Hemoglobin A1C: 7.4 % — AB (ref 4.0–5.6)

## 2018-03-30 MED ORDER — CANAGLIFLOZIN 300 MG PO TABS: 300.0000 mg | ORAL_TABLET | Freq: Every day | ORAL | 1 refills | Status: DC

## 2018-03-30 MED ORDER — BISOPROLOL-HYDROCHLOROTHIAZIDE 10-6.25 MG PO TABS: 1.0000 | ORAL_TABLET | Freq: Every day | ORAL | 1 refills | Status: DC

## 2018-03-30 MED ORDER — GLIMEPIRIDE 4 MG PO TABS: 8.0000 mg | ORAL_TABLET | Freq: Every day | ORAL | 1 refills | Status: DC

## 2018-03-30 MED ORDER — METFORMIN HCL 500 MG PO TABS: ORAL_TABLET | ORAL | 1 refills | Status: DC

## 2018-03-30 MED ORDER — RAMIPRIL 10 MG PO CAPS: 20.0000 mg | ORAL_CAPSULE | Freq: Every day | ORAL | 1 refills | Status: DC

## 2018-03-30 MED ORDER — AMLODIPINE BESYLATE 10 MG PO TABS: 10.0000 mg | ORAL_TABLET | Freq: Every day | ORAL | 1 refills | Status: DC

## 2018-03-30 NOTE — Progress Notes (Signed)
Subjective:    Patient ID: Victor Gonzales, male    DOB: [birth year], 56 y.o.   MRN: [id number]  Diabetes  He presents for his follow-up diabetic visit. He has type 2 diabetes mellitus. There are no hypoglycemic associated symptoms. Pertinent negatives for hypoglycemia include no headaches. There are no diabetic associated symptoms. Pertinent negatives for diabetes include no chest pain and no weakness. There are no hypoglycemic complications. There are no diabetic complications. He participates in exercise three times a week. He sees a podiatrist.  The patient comes in today for a wellness visit.  Results for orders placed or performed in visit on 03/30/18  POCT HgB A1C  Result Value Ref Range   Hemoglobin A1C 7.4 (A) 4.0 - 5.6 %   HbA1c POC (<> result, manual entry)     HbA1c, POC (prediabetic range)     HbA1c, POC (controlled diabetic range)      A review of their health history was completed.  A review of medications was also completed.  Any needed refills; none at this time  Eating habits: health conscious  Falls/  MVA accidents in past few months: none  Regular exercise: walk three times a  Week   Specialist pt sees on regular basis: none  Preventative health issues were discussed.   Additional concerns: none  Blood pressure medicine and blood pressure levels reviewed today with patient. Compliant with blood pressure medicine. States does not miss a dose. No obvious side effects. Blood pressure generally good when checked elsewhere. Watching salt intake.   Patient continues to take lipid medication regularly. No obvious side effects from it. Generally does not miss a dose. Prior blood work results are reviewed with patient. Patient continues to work on fat intake in diet  Patient claims compliance with diabetes medication. No obvious side effects. Reports no substantial low sugar spells. Most numbers are generally in good range when checked fasting. Generally does not  miss a dose of medication. Watching diabetic diet closely   Review of Systems  Constitutional: Negative for activity change, appetite change and fever.  HENT: Negative for congestion and rhinorrhea.   Eyes: Negative for discharge.  Respiratory: Negative for cough and wheezing.   Cardiovascular: Negative for chest pain.  Gastrointestinal: Negative for abdominal pain, blood in stool and vomiting.  Genitourinary: Negative for difficulty urinating and frequency.  Musculoskeletal: Negative for neck pain.  Skin: Negative for rash.  Allergic/Immunologic: Negative for environmental allergies and food allergies.  Neurological: Negative for weakness and headaches.  Psychiatric/Behavioral: Negative for agitation.  All other systems reviewed and are negative.      Objective:   Physical Exam  Constitutional: He appears well-developed and well-nourished.  HENT:  Head: Normocephalic and atraumatic.  Right Ear: External ear normal.  Left Ear: External ear normal.  Nose: Nose normal.  Mouth/Throat: Oropharynx is clear and moist.  Eyes: Pupils are equal, round, and reactive to light. EOM are normal.  Neck: Normal range of motion. Neck supple. No thyromegaly present.  Cardiovascular: Normal rate, regular rhythm and normal heart sounds.  No murmur heard. Pulmonary/Chest: Effort normal and breath sounds normal. No respiratory distress. He has no wheezes.  Abdominal: Soft. Bowel sounds are normal. He exhibits no distension and no mass. There is no tenderness.  Genitourinary: Penis normal.  Musculoskeletal: Normal range of motion. He exhibits no edema.  Lymphadenopathy:    He has no cervical adenopathy.  Neurological: He is alert. He exhibits normal muscle tone.  Skin: Skin is warm  and dry. No erythema.  Psychiatric: He has a normal mood and affect. His behavior is normal. Judgment normal.  Vitals reviewed.         Assessment & Plan:  Impression well adult exam.  Up-to-date on colonoscopy  per patient.  Diet discussed.  Exercise discussed.  Vaccines discussed and administered were appropriate  2.  Type 2 diabetes.  Suboptimal control discussed we will increase Invokana rationale discussed diet and exercise  3 hypertension good control discussed maintain same meds   Follow-up in 6 months diet exercise discussed adjust medicines as requested

## 2018-04-26 DIAGNOSIS — Z719 Counseling, unspecified: Secondary | ICD-10-CM | POA: Diagnosis not present

## 2018-04-26 DIAGNOSIS — Z008 Encounter for other general examination: Secondary | ICD-10-CM | POA: Diagnosis not present

## 2018-04-26 DIAGNOSIS — I10 Essential (primary) hypertension: Secondary | ICD-10-CM | POA: Diagnosis not present

## 2018-04-26 DIAGNOSIS — E119 Type 2 diabetes mellitus without complications: Secondary | ICD-10-CM | POA: Diagnosis not present

## 2018-05-06 DIAGNOSIS — J029 Acute pharyngitis, unspecified: Secondary | ICD-10-CM | POA: Diagnosis not present

## 2018-05-06 DIAGNOSIS — J209 Acute bronchitis, unspecified: Secondary | ICD-10-CM | POA: Diagnosis not present

## 2018-05-06 DIAGNOSIS — J329 Chronic sinusitis, unspecified: Secondary | ICD-10-CM | POA: Diagnosis not present

## 2018-05-06 DIAGNOSIS — J069 Acute upper respiratory infection, unspecified: Secondary | ICD-10-CM | POA: Diagnosis not present

## 2018-09-28 ENCOUNTER — Ambulatory Visit: Payer: BLUE CROSS/BLUE SHIELD | Admitting: Family Medicine

## 2018-10-01 ENCOUNTER — Other Ambulatory Visit: Payer: Self-pay | Admitting: Family Medicine

## 2018-10-03 NOTE — Telephone Encounter (Signed)
Schedule virtual visit next week, then may ref times one

## 2018-10-04 ENCOUNTER — Other Ambulatory Visit: Payer: Self-pay | Admitting: Family Medicine

## 2018-10-04 NOTE — Telephone Encounter (Signed)
sched six mo visit, then may fill times one

## 2018-10-04 NOTE — Telephone Encounter (Signed)
Tried to contact patient. Pt voicemail is full; unable to leave message 

## 2018-10-04 NOTE — Telephone Encounter (Signed)
Pt has other med request; unable to leave voice message

## 2018-10-13 NOTE — Telephone Encounter (Signed)
Telephone call- voicemail is full 

## 2018-10-13 NOTE — Telephone Encounter (Signed)
Please call and schedule pt and route message back to clinical pool so we can send in med after appt is made   

## 2018-10-14 ENCOUNTER — Other Ambulatory Visit: Payer: Self-pay

## 2018-10-14 MED ORDER — DAPAGLIFLOZIN PROPANEDIOL 10 MG PO TABS: ORAL_TABLET | ORAL | 0 refills | Status: DC

## 2018-10-14 MED ORDER — AMLODIPINE BESYLATE 10 MG PO TABS: 10.0000 mg | ORAL_TABLET | Freq: Every day | ORAL | 0 refills | Status: DC

## 2018-10-14 MED ORDER — METFORMIN HCL 500 MG PO TABS: ORAL_TABLET | ORAL | 0 refills | Status: DC

## 2018-10-14 NOTE — Telephone Encounter (Signed)
Insurance will no longer cover Invokana. Please advise. Thank you.

## 2018-10-14 NOTE — Telephone Encounter (Signed)
Pt is scheduled for 5/28.  Pt's insurance will no longer cover the invokana and he is needing something different called in.

## 2018-10-14 NOTE — Telephone Encounter (Signed)
Change to farxiga ten mg, ref times one all

## 2018-10-20 ENCOUNTER — Other Ambulatory Visit: Payer: Self-pay

## 2018-10-20 ENCOUNTER — Encounter: Payer: Self-pay | Admitting: Family Medicine

## 2018-10-20 ENCOUNTER — Ambulatory Visit (INDEPENDENT_AMBULATORY_CARE_PROVIDER_SITE_OTHER): Payer: BLUE CROSS/BLUE SHIELD | Admitting: Family Medicine

## 2018-10-20 VITALS — BP 140/82 | Wt 158.0 lb

## 2018-10-20 DIAGNOSIS — I10 Essential (primary) hypertension: Secondary | ICD-10-CM | POA: Diagnosis not present

## 2018-10-20 DIAGNOSIS — E1140 Type 2 diabetes mellitus with diabetic neuropathy, unspecified: Secondary | ICD-10-CM

## 2018-10-20 MED ORDER — METFORMIN HCL 500 MG PO TABS: ORAL_TABLET | ORAL | 5 refills | Status: DC

## 2018-10-20 MED ORDER — GLIMEPIRIDE 4 MG PO TABS: 8.0000 mg | ORAL_TABLET | Freq: Every day | ORAL | 1 refills | Status: DC

## 2018-10-20 MED ORDER — DAPAGLIFLOZIN PROPANEDIOL 10 MG PO TABS: ORAL_TABLET | ORAL | 0 refills | Status: DC

## 2018-10-20 MED ORDER — BISOPROLOL-HYDROCHLOROTHIAZIDE 10-6.25 MG PO TABS: 1.0000 | ORAL_TABLET | Freq: Every day | ORAL | 1 refills | Status: DC

## 2018-10-20 MED ORDER — RAMIPRIL 10 MG PO CAPS: 20.0000 mg | ORAL_CAPSULE | Freq: Every day | ORAL | 5 refills | Status: DC

## 2018-10-20 MED ORDER — AMLODIPINE BESYLATE 10 MG PO TABS: 10.0000 mg | ORAL_TABLET | Freq: Every day | ORAL | 0 refills | Status: DC

## 2018-10-20 NOTE — Progress Notes (Signed)
   Subjective:    Patient ID: Victor Gonzales, male    DOB: 04-25-1961, 58 y.o.   MRN: [id number]  Diabetes  He presents for his follow-up diabetic visit. He has type 2 diabetes mellitus. There are no hypoglycemic associated symptoms. There are no diabetic associated symptoms. There are no hypoglycemic complications. There are no diabetic complications. He sees a podiatrist.Eye exam is current.  Hypertension  This is a chronic problem. Risk factors for coronary artery disease include diabetes mellitus. There are no compliance problems.     Virtual Visit via Video Note  I connected with Victor Gonzales on 10/20/18 at  9:30 AM EDT by a video enabled telemedicine application and verified that I am speaking with the correct person using two identifiers.  Location: Patient: home Provider: office   I discussed the limitations of evaluation and management by telemedicine and the availability of in person appointments. The patient expressed understanding and agreed to proceed.  History of Present Illness:    Observations/Objective:   Assessment and Plan:   Follow Up Instructions:    I discussed the assessment and treatment plan with the patient. The patient was provided an opportunity to ask questions and all were answered. The patient agreed with the plan and demonstrated an understanding of the instructions.   The patient was advised to call back or seek an in-person evaluation if the symptoms worsen or if the condition fails to improve as anticipated.  I provided 25 minutes of non-face-to-face time during this encounter.  Patient claims compliance with diabetes medication. No obvious side effects. Reports no substantial low sugar spells. Most numbers are generally in good range when checked fasting. Generally does not miss a dose of medication. Watching diabetic diet closely  Blood pressure medicine and blood pressure levels reviewed today with patient. Compliant with blood pressure  medicine. States does not miss a dose. No obvious side effects. Blood pressure generally good when checked elsewhere. Watching salt intake.   Patient concerned that Invokana no longer is covered.  Long discussion held.  Farsi got is a reasonable alternative.  Potential side effects benefits discussed. Marlowe Shores, LPN   Review of Systems .rs No headache, no major weight loss or weight gain, no chest pain no back pain abdominal pain no change in bowel habits complete ROS otherwise negative     Objective:   Physical Exam  Virtual visit      Assessment & Plan:  Impression type 2 diabetes.  Overall good control discussed compliance discussed changes in medication discussed impression of 9 discussed  2.  Hypertension.  Good control.  Maintain same meds.  Diet exercise discussed  Greater than 50% of this 25 minute face to face visit was spent in counseling and discussion and coordination of care regarding the above diagnosis/diagnosies

## 2019-01-02 DIAGNOSIS — E119 Type 2 diabetes mellitus without complications: Secondary | ICD-10-CM | POA: Diagnosis not present

## 2019-01-07 ENCOUNTER — Other Ambulatory Visit: Payer: Self-pay | Admitting: Family Medicine

## 2019-02-03 ENCOUNTER — Ambulatory Visit (INDEPENDENT_AMBULATORY_CARE_PROVIDER_SITE_OTHER): Payer: BC Managed Care – PPO | Admitting: Family Medicine

## 2019-02-03 ENCOUNTER — Encounter: Payer: Self-pay | Admitting: Family Medicine

## 2019-02-03 ENCOUNTER — Other Ambulatory Visit: Payer: Self-pay

## 2019-02-03 VITALS — BP 140/90 | Temp 97.6°F | Wt 155.6 lb

## 2019-02-03 DIAGNOSIS — I878 Other specified disorders of veins: Secondary | ICD-10-CM | POA: Diagnosis not present

## 2019-02-03 DIAGNOSIS — E1140 Type 2 diabetes mellitus with diabetic neuropathy, unspecified: Secondary | ICD-10-CM

## 2019-02-03 DIAGNOSIS — I10 Essential (primary) hypertension: Secondary | ICD-10-CM | POA: Diagnosis not present

## 2019-02-03 DIAGNOSIS — Z23 Encounter for immunization: Secondary | ICD-10-CM

## 2019-02-03 LAB — POCT GLYCOSYLATED HEMOGLOBIN (HGB A1C)
HbA1c POC (<> result, manual entry): 0 % (ref 4.0–5.6)
Hemoglobin A1C: 8.3 % — AB (ref 4.0–5.6)

## 2019-02-03 MED ORDER — AMLODIPINE BESYLATE 10 MG PO TABS: 10.0000 mg | ORAL_TABLET | Freq: Every day | ORAL | 1 refills | Status: DC

## 2019-02-03 MED ORDER — METFORMIN HCL 500 MG PO TABS: ORAL_TABLET | ORAL | 1 refills | Status: DC

## 2019-02-03 MED ORDER — FARXIGA 10 MG PO TABS: ORAL_TABLET | ORAL | 1 refills | Status: DC

## 2019-02-03 MED ORDER — BISOPROLOL-HYDROCHLOROTHIAZIDE 10-6.25 MG PO TABS: 1.0000 | ORAL_TABLET | Freq: Every day | ORAL | 1 refills | Status: DC

## 2019-02-03 MED ORDER — GLIMEPIRIDE 4 MG PO TABS: 8.0000 mg | ORAL_TABLET | Freq: Every day | ORAL | 1 refills | Status: DC

## 2019-02-03 MED ORDER — RAMIPRIL 10 MG PO CAPS: 20.0000 mg | ORAL_CAPSULE | Freq: Every day | ORAL | 1 refills | Status: DC

## 2019-02-03 NOTE — Progress Notes (Signed)
   Subjective:    Patient ID: Victor Gonzales, male    DOB: 03-31-1961, 58 y.o.   MRN: [id number]  Diabetes He presents for his follow-up diabetic visit. He has type 2 diabetes mellitus. There are no hypoglycemic associated symptoms. (Pt had some chest pain but that has went away;( pt believes he had pulled a muscle)) There are no hypoglycemic complications. There are no diabetic complications. He does not see a podiatrist.Eye exam is current.  Pt has been waiting to take Wilder Glade because he wants to make sure that is what provider wants him to take.    Results for orders placed or performed in visit on 02/03/19  POCT HgB A1C  Result Value Ref Range   Hemoglobin A1C 8.3 (A) 4.0 - 5.6 %   HbA1c POC (<> result, manual entry) . 4.0 - 5.6 %   HbA1c, POC (prediabetic range)     HbA1c, POC (controlled diabetic range)      Had some swelling of the feet overall better   Slight tingling of the feet.  Known history of neuropathy.  Reports overall better than before  Blood pressure medicine and blood pressure levels reviewed today with patient. Compliant with blood pressure medicine. States does not miss a dose. No obvious side effects. Blood pressure generally good when checked elsewhere. Watching salt intake.    Review of Systems No headache, no major weight loss or weight gain, no chest pain no back pain abdominal pain no change in bowel habits complete ROS otherwise negative     Objective:   Physical Exam Alert and oriented, vitals reviewed and stable, NAD ENT-TM's and ext canals WNL bilat via otoscopic exam Soft palate, tonsils and post pharynx WNL via oropharyngeal exam Neck-symmetric, no masses; thyroid nonpalpable and nontender Pulmonary-no tachypnea or accessory muscle use; Clear without wheezes via auscultation Card--no abnrml murmurs, rhythm reg and rate WNL Carotid pulses symmetric, without bruits   Feet exam pulses good sensory slightly diminished distally     Assessment &  Plan:  Impression 1 type II diet A1c is above discussed on the press on the [place] rationale discussed  2.  Sensory neuropathy clinically improved patient patient noted ongoing improvement sugars  3.  Hypertension good control discussed same  Diet exercise discussed.  Flu shot today.  Recheck in 4 months for wellness plus chronic

## 2019-02-17 ENCOUNTER — Telehealth: Payer: Self-pay | Admitting: Family Medicine

## 2019-02-17 NOTE — Telephone Encounter (Signed)
Nurses please touch base with family find out how low were the sugars? Also verify how much metformin currently taking How much Amaryl currently taking Once we have this then we have more to go on thank you

## 2019-02-17 NOTE — Telephone Encounter (Signed)
Left message to return call. No DPR on file to speak to wife

## 2019-02-17 NOTE — Telephone Encounter (Signed)
Pt was put on dapagliflozin propanediol (FARXIGA) 10 MG TABS tablet recently & pt's wife feels that this has caused his blood sugar to be pretty low a couple times yesterday   Pt's wife had him to only take one metformin until we can get suggestions from doctor   Please advise & call pt   Explained that Dr. Richardson Landry would be back Monday, pt's wife requested that Dr. Nicki Reaper take a look at this      [facility]

## 2019-02-20 NOTE — Telephone Encounter (Signed)
Please touch base with family find out more information for this message to Dr. Richardson Landry or have family set up a follow-up phone call with Dr. Venetia Maxon

## 2019-02-20 NOTE — Telephone Encounter (Signed)
Left message to return call 

## 2019-02-20 NOTE — Telephone Encounter (Signed)
Called pt he states his lowest sugar was 101 last week and that was fasting. Blood sugar in the 120's after eating. Fasting sugar this morning was 150. He is taking metformin 500mg  2 bid and amaryl 4mg  2qd. States he feels fine. Thinks he just had to get use to the new medicine.

## 2019-02-20 NOTE — Telephone Encounter (Signed)
Sounds good cst

## 2019-02-21 ENCOUNTER — Other Ambulatory Visit: Payer: Self-pay | Admitting: *Deleted

## 2019-02-21 MED ORDER — BISOPROLOL-HYDROCHLOROTHIAZIDE 10-6.25 MG PO TABS: 1.0000 | ORAL_TABLET | Freq: Every day | ORAL | 0 refills | Status: DC

## 2019-02-21 NOTE — Telephone Encounter (Signed)
Pt notified and verbalized understanding.

## 2019-04-03 ENCOUNTER — Telehealth: Payer: Self-pay | Admitting: Family Medicine

## 2019-04-03 ENCOUNTER — Other Ambulatory Visit: Payer: Self-pay | Admitting: *Deleted

## 2019-04-03 MED ORDER — BISOPROLOL-HYDROCHLOROTHIAZIDE 10-6.25 MG PO TABS: 1.0000 | ORAL_TABLET | Freq: Every day | ORAL | 0 refills | Status: DC

## 2019-04-03 NOTE — Telephone Encounter (Signed)
Med sent to pharm. Pt notified on voicemail.  

## 2019-04-03 NOTE — Telephone Encounter (Signed)
Patient is requesting refill on bisoprolol-hydrochlorothiazide 10/625 states he is completely out and has appointment 11/23 for followup on medications called into [facility] [address]

## 2019-04-17 ENCOUNTER — Ambulatory Visit (INDEPENDENT_AMBULATORY_CARE_PROVIDER_SITE_OTHER): Payer: Federal, State, Local not specified - PPO | Admitting: Family Medicine

## 2019-04-17 ENCOUNTER — Encounter: Payer: Self-pay | Admitting: Family Medicine

## 2019-04-17 ENCOUNTER — Other Ambulatory Visit: Payer: Self-pay

## 2019-04-17 DIAGNOSIS — E1140 Type 2 diabetes mellitus with diabetic neuropathy, unspecified: Secondary | ICD-10-CM

## 2019-04-17 DIAGNOSIS — I10 Essential (primary) hypertension: Secondary | ICD-10-CM

## 2019-04-17 MED ORDER — AMLODIPINE BESYLATE 10 MG PO TABS: 10.0000 mg | ORAL_TABLET | Freq: Every day | ORAL | 1 refills | Status: DC

## 2019-04-17 MED ORDER — METFORMIN HCL 500 MG PO TABS: ORAL_TABLET | ORAL | 1 refills | Status: DC

## 2019-04-17 MED ORDER — BISOPROLOL-HYDROCHLOROTHIAZIDE 10-6.25 MG PO TABS: 1.0000 | ORAL_TABLET | Freq: Every day | ORAL | 1 refills | Status: DC

## 2019-04-17 MED ORDER — FARXIGA 10 MG PO TABS: ORAL_TABLET | ORAL | 1 refills | Status: DC

## 2019-04-17 MED ORDER — GLIMEPIRIDE 4 MG PO TABS: 8.0000 mg | ORAL_TABLET | Freq: Every day | ORAL | 1 refills | Status: DC

## 2019-04-17 MED ORDER — RAMIPRIL 10 MG PO CAPS: 20.0000 mg | ORAL_CAPSULE | Freq: Every day | ORAL | 1 refills | Status: DC

## 2019-04-17 NOTE — Progress Notes (Signed)
   Subjective:  Audio only  Patient ID: Victor Gonzales, male    DOB: 02-13-1961, 58 y.o.   MRN: [id number]  Diabetes He presents for his follow-up diabetic visit. He has type 2 diabetes mellitus. He is compliant with treatment all of the time. He is following a generally healthy diet. Exercise: walks about 3 times a week. Home blood sugar record trend: 141 this morning. some mornings 115-120. Eye exam is current (about 2 months ago).   Pt states no concerns today.   Virtual Visit via Telephone Note  I connected with Victor Gonzales on 04/17/19 at  9:00 AM EST by telephone and verified that I am speaking with the correct person using two identifiers.  Location: Patient: home Provider: office   I discussed the limitations, risks, security and privacy concerns of performing an evaluation and management service by telephone and the availability of in person appointments. I also discussed with the patient that there may be a patient responsible charge related to this service. The patient expressed understanding and agreed to proceed.   History of Present Illness:    Observations/Objective:   Assessment and Plan:   Follow Up Instructions:    I discussed the assessment and treatment plan with the patient. The patient was provided an opportunity to ask questions and all were answered. The patient agreed with the plan and demonstrated an understanding of the instructions.   The patient was advised to call back or seek an in-person evaluation if the symptoms worsen or if the condition fails to improve as anticipated.  I provided 15 minutes of non-face-to-face time during this encounter.  Patient reports considerable improvement with [place].  Handling the medication well.  Minimal side effects.  Sugars have improved considerably.  He is exercising several times per week.  Unfortunately continues to remain unemployed   Review of Systems No headache, no major weight loss or weight gain,  no chest pain no back pain abdominal pain no change in bowel habits complete ROS otherwise negative     Objective:   Physical Exam  Virtual      Assessment & Plan:  Impression type 2 diabetes.  Control improved compliance of medication discussed diet discussed maintain same  2.  Hypertension patient maintain same  Flu shot already given diet exercise discussed medications refilled follow-up in 6 months

## 2019-04-17 NOTE — Addendum Note (Signed)
Addended by: Carmelina Noun on: 04/17/2019 09:23 AM   Modules accepted: Orders

## 2019-06-05 ENCOUNTER — Encounter: Payer: BC Managed Care – PPO | Admitting: Family Medicine

## 2019-06-12 ENCOUNTER — Other Ambulatory Visit: Payer: Self-pay

## 2019-06-12 ENCOUNTER — Ambulatory Visit (INDEPENDENT_AMBULATORY_CARE_PROVIDER_SITE_OTHER): Payer: Federal, State, Local not specified - PPO | Admitting: Family Medicine

## 2019-06-12 ENCOUNTER — Encounter: Payer: Self-pay | Admitting: Family Medicine

## 2019-06-12 VITALS — BP 130/90 | Temp 95.7°F | Ht 67.0 in | Wt 152.0 lb

## 2019-06-12 DIAGNOSIS — I10 Essential (primary) hypertension: Secondary | ICD-10-CM | POA: Diagnosis not present

## 2019-06-12 DIAGNOSIS — Z125 Encounter for screening for malignant neoplasm of prostate: Secondary | ICD-10-CM

## 2019-06-12 DIAGNOSIS — Z79899 Other long term (current) drug therapy: Secondary | ICD-10-CM

## 2019-06-12 DIAGNOSIS — Z0000 Encounter for general adult medical examination without abnormal findings: Secondary | ICD-10-CM | POA: Diagnosis not present

## 2019-06-12 DIAGNOSIS — E1140 Type 2 diabetes mellitus with diabetic neuropathy, unspecified: Secondary | ICD-10-CM

## 2019-06-12 DIAGNOSIS — E785 Hyperlipidemia, unspecified: Secondary | ICD-10-CM

## 2019-06-12 LAB — POCT GLYCOSYLATED HEMOGLOBIN (HGB A1C): Hemoglobin A1C: 8.5 % — AB (ref 4.0–5.6)

## 2019-06-12 MED ORDER — AMLODIPINE BESYLATE 10 MG PO TABS: 10.0000 mg | ORAL_TABLET | Freq: Every day | ORAL | 1 refills | Status: DC

## 2019-06-12 MED ORDER — FARXIGA 10 MG PO TABS: ORAL_TABLET | ORAL | 1 refills | Status: DC

## 2019-06-12 MED ORDER — METFORMIN HCL 500 MG PO TABS: ORAL_TABLET | ORAL | 1 refills | Status: DC

## 2019-06-12 MED ORDER — BISOPROLOL-HYDROCHLOROTHIAZIDE 10-6.25 MG PO TABS: 1.0000 | ORAL_TABLET | Freq: Every day | ORAL | 1 refills | Status: DC

## 2019-06-12 MED ORDER — GLIMEPIRIDE 4 MG PO TABS: 8.0000 mg | ORAL_TABLET | Freq: Every day | ORAL | 1 refills | Status: DC

## 2019-06-12 MED ORDER — RAMIPRIL 10 MG PO CAPS: 20.0000 mg | ORAL_CAPSULE | Freq: Every day | ORAL | 1 refills | Status: DC

## 2019-06-12 NOTE — Progress Notes (Signed)
Subjective:    Patient ID: Victor Gonzales, male    DOB: 1960/08/30, 59 y.o.   MRN: [id number]  Diabetes He presents for his follow-up diabetic visit. He has type 2 diabetes mellitus. There are no hypoglycemic associated symptoms. Pertinent negatives for hypoglycemia include no headaches. There are no diabetic associated symptoms. Pertinent negatives for diabetes include no chest pain and no weakness. There are no hypoglycemic complications. There are no diabetic complications. Risk factors for coronary artery disease include hypertension. Compliance with diabetes treatment: checks sugars 3-4 times a week. He is following a generally healthy diet. He has not had a previous visit with a dietitian. He participates in exercise three times a week. He does not see a podiatrist.Eye exam is not current.   The patient comes in today for a wellness visit.    A review of their health history was completed.  A review of medications was also completed.  Any needed refills; none at this time  Eating habits: eats pretty good   Falls/  MVA accidents in past few months: none  Regular exercise: walks about 3 times a week   Specialist pt sees on regular basis: none  Preventative health issues were discussed.   Additional concerns:   Results for orders placed or performed in visit on 06/12/19  POCT HgB A1C  Result Value Ref Range   Hemoglobin A1C 8.5 (A) 4.0 - 5.6 %   HbA1c POC (<> result, manual entry)     HbA1c, POC (prediabetic range)     HbA1c, POC (controlled diabetic range)      Review of Systems  Constitutional: Negative for activity change, appetite change and fever.  HENT: Negative for congestion and rhinorrhea.   Eyes: Negative for discharge.  Respiratory: Negative for cough and wheezing.   Cardiovascular: Negative for chest pain.  Gastrointestinal: Negative for abdominal pain, blood in stool and vomiting.  Genitourinary: Negative for difficulty urinating and frequency.    Musculoskeletal: Negative for neck pain.  Skin: Negative for rash.  Allergic/Immunologic: Negative for environmental allergies and food allergies.  Neurological: Negative for weakness and headaches.  Psychiatric/Behavioral: Negative for agitation.  All other systems reviewed and are negative.      Objective:   Physical Exam Vitals reviewed.  Constitutional:      Appearance: He is well-developed.  HENT:     Head: Normocephalic and atraumatic.     Right Ear: External ear normal.     Left Ear: External ear normal.     Nose: Nose normal.  Eyes:     Pupils: Pupils are equal, round, and reactive to light.  Neck:     Thyroid: No thyromegaly.  Cardiovascular:     Rate and Rhythm: Normal rate and regular rhythm.     Heart sounds: Normal heart sounds. No murmur.  Pulmonary:     Effort: Pulmonary effort is normal. No respiratory distress.     Breath sounds: Normal breath sounds. No wheezing.  Abdominal:     General: Bowel sounds are normal. There is no distension.     Palpations: Abdomen is soft. There is no mass.     Tenderness: There is no abdominal tenderness.  Genitourinary:    Penis: Normal.   Musculoskeletal:        General: Normal range of motion.     Cervical back: Normal range of motion and neck supple.  Lymphadenopathy:     Cervical: No cervical adenopathy.  Skin:    General: Skin is warm and dry.  Findings: No erythema.  Neurological:     Mental Status: He is alert.     Motor: No abnormal muscle tone.  Psychiatric:        Behavior: Behavior normal.        Judgment: Judgment normal.     Prostate within normal limits     Assessment & Plan:  Impression wellness exam.  Diet discussed exercise discussed.  Patient needs screening blood work.  Colonoscopy due in 5 more years.  Flu shot already given.  COVID-19 vaccine discussed  2.  Type 2 diabetes.  Suboptimal control.  Maxed out on 3 medications.  Patient to work harder on diet and exercise.  Recheck in  spring if A1c is still above 7.5 will initiate nocturnal insulin  3.  Hypertension good control discussed maintain same meds  Follow-up in 3 months is discussed

## 2019-06-13 LAB — LIPID PANEL
Chol/HDL Ratio: 3.5 ratio (ref 0.0–5.0)
Cholesterol, Total: 177 mg/dL (ref 100–199)
HDL: 51 mg/dL (ref >39)
LDL Chol Calc (NIH): 99 mg/dL (ref 0–99)
Triglycerides: 158 mg/dL — ABNORMAL HIGH (ref 0–149)
VLDL Cholesterol Cal: 27 mg/dL (ref 5–40)

## 2019-06-13 LAB — MICROALBUMIN / CREATININE URINE RATIO
Creatinine, Urine: 101 mg/dL
Microalb/Creat Ratio: 10 mg/g creat (ref 0–29)
Microalbumin, Urine: 10.6 ug/mL

## 2019-06-13 LAB — HEPATIC FUNCTION PANEL
ALT: 78 IU/L — ABNORMAL HIGH (ref 0–44)
AST: 41 IU/L — ABNORMAL HIGH (ref 0–40)
Albumin: 4.8 g/dL (ref 3.8–4.9)
Alkaline Phosphatase: 78 IU/L (ref 39–117)
Bilirubin Total: 0.9 mg/dL (ref 0.0–1.2)
Bilirubin, Direct: 0.25 mg/dL (ref 0.00–0.40)
Total Protein: 7.5 g/dL (ref 6.0–8.5)

## 2019-06-13 LAB — PSA: Prostate Specific Ag, Serum: 0.6 ng/mL (ref 0.0–4.0)

## 2019-06-13 LAB — BASIC METABOLIC PANEL
BUN/Creatinine Ratio: 14 (ref 9–20)
BUN: 13 mg/dL (ref 6–24)
CO2: 23 mmol/L (ref 20–29)
Calcium: 10 mg/dL (ref 8.7–10.2)
Chloride: 100 mmol/L (ref 96–106)
Creatinine, Ser: 0.95 mg/dL (ref 0.76–1.27)
GFR calc Af Amer: 101 mL/min/{1.73_m2} (ref >59)
GFR calc non Af Amer: 87 mL/min/{1.73_m2} (ref >59)
Glucose: 212 mg/dL — ABNORMAL HIGH (ref 65–99)
Potassium: 5 mmol/L (ref 3.5–5.2)
Sodium: 138 mmol/L (ref 134–144)

## 2019-06-18 ENCOUNTER — Encounter: Payer: Self-pay | Admitting: Family Medicine

## 2019-06-28 ENCOUNTER — Encounter: Payer: Self-pay | Admitting: Family Medicine

## 2019-09-06 ENCOUNTER — Telehealth: Payer: Self-pay | Admitting: *Deleted

## 2019-09-11 ENCOUNTER — Ambulatory Visit: Payer: Federal, State, Local not specified - PPO | Admitting: Family Medicine

## 2019-09-11 ENCOUNTER — Other Ambulatory Visit: Payer: Self-pay

## 2019-09-11 VITALS — BP 128/80 | Temp 98.2°F | Ht 67.0 in | Wt 158.0 lb

## 2019-09-11 DIAGNOSIS — E119 Type 2 diabetes mellitus without complications: Secondary | ICD-10-CM | POA: Diagnosis not present

## 2019-09-11 LAB — POCT GLYCOSYLATED HEMOGLOBIN (HGB A1C): Hemoglobin A1C: 5.8 % — AB (ref 4.0–5.6)

## 2019-09-11 MED ORDER — AMLODIPINE BESYLATE 10 MG PO TABS: 10.0000 mg | ORAL_TABLET | Freq: Every day | ORAL | 1 refills | Status: DC

## 2019-09-11 MED ORDER — METFORMIN HCL 500 MG PO TABS: ORAL_TABLET | ORAL | 1 refills | Status: DC

## 2019-09-11 MED ORDER — GLIMEPIRIDE 4 MG PO TABS: 8.0000 mg | ORAL_TABLET | Freq: Every day | ORAL | 1 refills | Status: DC

## 2019-09-11 MED ORDER — FARXIGA 10 MG PO TABS: ORAL_TABLET | ORAL | 1 refills | Status: DC

## 2019-09-11 MED ORDER — BISOPROLOL-HYDROCHLOROTHIAZIDE 10-6.25 MG PO TABS: 1.0000 | ORAL_TABLET | Freq: Every day | ORAL | 1 refills | Status: DC

## 2019-09-11 MED ORDER — RAMIPRIL 10 MG PO CAPS: 20.0000 mg | ORAL_CAPSULE | Freq: Every day | ORAL | 1 refills | Status: DC

## 2019-09-11 MED ORDER — HYOSCYAMINE SULFATE SL 0.125 MG SL SUBL: SUBLINGUAL_TABLET | SUBLINGUAL | 0 refills | Status: DC

## 2019-09-11 NOTE — Progress Notes (Signed)
   Subjective:    Patient ID: Victor Gonzales, male    DOB: 15-Sep-1960, 59 y.o.   MRN: [id number]  Diabetes He presents for his follow-up diabetic visit. He has type 2 diabetes mellitus. Current diabetic treatments: metformin, amaryl, farxiga. He is compliant with treatment all of the time. He is following a generally healthy diet. Home blood sugar record trend: 119 -130 in the mornings. He sees a podiatrist (dr Pricilla Holm in [place]).Eye exam is current (due again in may).   Results for orders placed or performed in visit on 09/11/19  POCT glycosylated hemoglobin (Hb A1C)  Result Value Ref Range   Hemoglobin A1C 5.8 (A) 4.0 - 5.6 %   HbA1c POC (<> result, manual entry)     HbA1c, POC (prediabetic range)     HbA1c, POC (controlled diabetic range)     Patient claims compliance with diabetes medication. No obvious side effects. Reports no substantial low sugar spells. Most numbers are generally in good range when checked fasting. Generally does not miss a dose of medication. Watching diabetic diet closely  Blood pressure medicine and blood pressure levels reviewed today with patient. Compliant with blood pressure medicine. States does not miss a dose. No obvious side effects. Blood pressure generally good when checked elsewhere. Watching salt intake.   Walking some  Review of Systems No headache no chest pain no shortness of breath    Objective:   Physical Exam  Alert vitals stable, NAD. Blood pressure good on repeat. HEENT normal. Lungs clear. Heart regular rate and rhythm.       Assessment & Plan:  Impression 1 type 2 diabetes.  A1c much improved.  Patient congratulated working a lot harder on diet no low sugar spells  2.  Hypertension good control  Follow-up in 6 months all meds refilled diet exercise discussed

## 2019-09-12 NOTE — Telephone Encounter (Signed)
Left message for pt to return call to let him know Marcelline Deist was approved.

## 2019-10-25 ENCOUNTER — Telehealth: Payer: Self-pay | Admitting: *Deleted

## 2019-10-25 NOTE — Telephone Encounter (Signed)
Patient notified of Dr. Lubertha Basque recommendation. He is currently taking 2 metformin in the morning and 2 at night -- he would like to hold the 2 at night and continue the 2 in the am --if this is ok with you. He will send readings in a week.

## 2019-10-25 NOTE — Telephone Encounter (Signed)
Have pt stop metformin and check blood glucose daily for the next week and call us back with the numbers he's seeing in morning.   Thx,   Dr. Ladona Ridgel

## 2019-10-25 NOTE — Telephone Encounter (Signed)
Hi,  He can still take the 2 in morning and 1 at night.  That should help with the blood glucose in morning.   Thx,   Dr. Ladona Ridgel

## 2019-10-26 NOTE — Telephone Encounter (Signed)
Patient notified and verbalized understanding. He will send [place] readings in a week

## 2020-02-05 DIAGNOSIS — E119 Type 2 diabetes mellitus without complications: Secondary | ICD-10-CM | POA: Diagnosis not present

## 2020-02-05 LAB — HM DIABETES EYE EXAM

## 2020-02-19 ENCOUNTER — Telehealth: Payer: Self-pay

## 2020-02-19 DIAGNOSIS — E119 Type 2 diabetes mellitus without complications: Secondary | ICD-10-CM

## 2020-02-19 DIAGNOSIS — E785 Hyperlipidemia, unspecified: Secondary | ICD-10-CM

## 2020-02-19 DIAGNOSIS — I10 Essential (primary) hypertension: Secondary | ICD-10-CM

## 2020-02-19 DIAGNOSIS — Z79899 Other long term (current) drug therapy: Secondary | ICD-10-CM

## 2020-02-19 NOTE — Telephone Encounter (Signed)
Pls order, cbc, cmp, lipids, urine microabl, a1c.  Thx, dr. Ladona Ridgel

## 2020-02-19 NOTE — Telephone Encounter (Signed)
Pt has follow up on 11/1 and would like to know if lab work Is needed before visit.  

## 2020-02-19 NOTE — Telephone Encounter (Signed)
Last labs 05/2019: Lipid, Liver, Met 7, HgbA1c, PSA and urine Microprotien

## 2020-02-19 NOTE — Telephone Encounter (Signed)
Blood work ordered in Epic. Patient notified. 

## 2020-03-18 ENCOUNTER — Telehealth: Payer: Self-pay | Admitting: Family Medicine

## 2020-03-18 DIAGNOSIS — I10 Essential (primary) hypertension: Secondary | ICD-10-CM | POA: Diagnosis not present

## 2020-03-18 DIAGNOSIS — E785 Hyperlipidemia, unspecified: Secondary | ICD-10-CM | POA: Diagnosis not present

## 2020-03-18 DIAGNOSIS — Z79899 Other long term (current) drug therapy: Secondary | ICD-10-CM | POA: Diagnosis not present

## 2020-03-18 DIAGNOSIS — E119 Type 2 diabetes mellitus without complications: Secondary | ICD-10-CM | POA: Diagnosis not present

## 2020-03-18 MED ORDER — RAMIPRIL 10 MG PO CAPS
20.0000 mg | ORAL_CAPSULE | Freq: Every day | ORAL | 0 refills | Status: DC
Start: 2020-03-18 — End: 2020-03-25

## 2020-03-18 MED ORDER — METFORMIN HCL 500 MG PO TABS
ORAL_TABLET | ORAL | 0 refills | Status: DC
Start: 2020-03-18 — End: 2020-03-25

## 2020-03-18 NOTE — Telephone Encounter (Signed)
30 day supply sent to pharmacy.  

## 2020-03-18 NOTE — Telephone Encounter (Signed)
[facility] [address] requesting refill on Metformin 500 mg tablets Take 2 tablet po BID and Ramipril 10 mg capsule take 2 capsules po daily. Pt has upcoming appt on 03/25/20. Please advise. Thank you

## 2020-03-18 NOTE — Telephone Encounter (Signed)
Pls give 30 days supply. No reiflls. Thx. Dr.Mykah Shin

## 2020-03-18 NOTE — Addendum Note (Signed)
Addended by: Marlowe Shores on: 03/18/2020 04:43 PM   Modules accepted: Orders

## 2020-03-19 LAB — CBC WITH DIFFERENTIAL/PLATELET
Basophils Absolute: 0.1 10*3/uL (ref 0.0–0.2)
Basos: 1 %
EOS (ABSOLUTE): 0.6 10*3/uL — ABNORMAL HIGH (ref 0.0–0.4)
Eos: 10 %
Hematocrit: 51.4 % — ABNORMAL HIGH (ref 37.5–51.0)
Hemoglobin: 16.8 g/dL (ref 13.0–17.7)
Immature Grans (Abs): 0.1 10*3/uL (ref 0.0–0.1)
Immature Granulocytes: 1 %
Lymphocytes Absolute: 2.2 10*3/uL (ref 0.7–3.1)
Lymphs: 36 %
MCH: 28.7 pg (ref 26.6–33.0)
MCHC: 32.7 g/dL (ref 31.5–35.7)
MCV: 88 fL (ref 79–97)
Monocytes Absolute: 0.6 10*3/uL (ref 0.1–0.9)
Monocytes: 10 %
Neutrophils Absolute: 2.7 10*3/uL (ref 1.4–7.0)
Neutrophils: 42 %
Platelets: 167 10*3/uL (ref 150–450)
RBC: 5.85 x10E6/uL — ABNORMAL HIGH (ref 4.14–5.80)
RDW: 13.2 % (ref 11.6–15.4)
WBC: 6.2 10*3/uL (ref 3.4–10.8)

## 2020-03-19 LAB — COMPREHENSIVE METABOLIC PANEL
ALT: 89 IU/L — ABNORMAL HIGH (ref 0–44)
AST: 54 IU/L — ABNORMAL HIGH (ref 0–40)
Albumin/Globulin Ratio: 1.8 (ref 1.2–2.2)
Albumin: 4.4 g/dL (ref 3.8–4.9)
Alkaline Phosphatase: 63 IU/L (ref 44–121)
BUN/Creatinine Ratio: 11 (ref 10–24)
BUN: 10 mg/dL (ref 8–27)
Bilirubin Total: 0.5 mg/dL (ref 0.0–1.2)
CO2: 22 mmol/L (ref 20–29)
Calcium: 9 mg/dL (ref 8.6–10.2)
Chloride: 105 mmol/L (ref 96–106)
Creatinine, Ser: 0.9 mg/dL (ref 0.76–1.27)
GFR calc Af Amer: 107 mL/min/{1.73_m2} (ref >59)
GFR calc non Af Amer: 93 mL/min/{1.73_m2} (ref >59)
Globulin, Total: 2.4 g/dL (ref 1.5–4.5)
Glucose: 187 mg/dL — ABNORMAL HIGH (ref 65–99)
Potassium: 4.4 mmol/L (ref 3.5–5.2)
Sodium: 141 mmol/L (ref 134–144)
Total Protein: 6.8 g/dL (ref 6.0–8.5)

## 2020-03-19 LAB — MICROALBUMIN / CREATININE URINE RATIO
Creatinine, Urine: 182.1 mg/dL
Microalb/Creat Ratio: 25 mg/g creat (ref 0–29)
Microalbumin, Urine: 45.5 ug/mL

## 2020-03-19 LAB — LIPID PANEL
Chol/HDL Ratio: 3.3 ratio (ref 0.0–5.0)
Cholesterol, Total: 159 mg/dL (ref 100–199)
HDL: 48 mg/dL (ref >39)
LDL Chol Calc (NIH): 92 mg/dL (ref 0–99)
Triglycerides: 102 mg/dL (ref 0–149)
VLDL Cholesterol Cal: 19 mg/dL (ref 5–40)

## 2020-03-19 LAB — HEMOGLOBIN A1C
Est. average glucose Bld gHb Est-mCnc: 203 mg/dL
Hgb A1c MFr Bld: 8.7 % — ABNORMAL HIGH (ref 4.8–5.6)

## 2020-03-25 ENCOUNTER — Ambulatory Visit: Payer: Federal, State, Local not specified - PPO | Admitting: Family Medicine

## 2020-03-25 ENCOUNTER — Other Ambulatory Visit: Payer: Self-pay

## 2020-03-25 ENCOUNTER — Encounter: Payer: Self-pay | Admitting: Family Medicine

## 2020-03-25 VITALS — BP 154/88 | HR 77 | Temp 98.2°F | Wt 156.2 lb

## 2020-03-25 DIAGNOSIS — I10 Essential (primary) hypertension: Secondary | ICD-10-CM | POA: Diagnosis not present

## 2020-03-25 DIAGNOSIS — Z23 Encounter for immunization: Secondary | ICD-10-CM

## 2020-03-25 DIAGNOSIS — E785 Hyperlipidemia, unspecified: Secondary | ICD-10-CM

## 2020-03-25 DIAGNOSIS — E1140 Type 2 diabetes mellitus with diabetic neuropathy, unspecified: Secondary | ICD-10-CM | POA: Diagnosis not present

## 2020-03-25 DIAGNOSIS — K760 Fatty (change of) liver, not elsewhere classified: Secondary | ICD-10-CM | POA: Diagnosis not present

## 2020-03-25 MED ORDER — RAMIPRIL 10 MG PO CAPS
20.0000 mg | ORAL_CAPSULE | Freq: Every day | ORAL | 2 refills | Status: DC
Start: 2020-03-25 — End: 2020-07-19

## 2020-03-25 MED ORDER — GLIMEPIRIDE 4 MG PO TABS
8.0000 mg | ORAL_TABLET | Freq: Every day | ORAL | 0 refills | Status: DC
Start: 2020-03-25 — End: 2020-06-12

## 2020-03-25 MED ORDER — AMLODIPINE BESYLATE 10 MG PO TABS
10.0000 mg | ORAL_TABLET | Freq: Every day | ORAL | 0 refills | Status: DC
Start: 2020-03-25 — End: 2020-08-13

## 2020-03-25 MED ORDER — BISOPROLOL-HYDROCHLOROTHIAZIDE 10-6.25 MG PO TABS
1.0000 | ORAL_TABLET | Freq: Every day | ORAL | 0 refills | Status: DC
Start: 2020-03-25 — End: 2020-08-13

## 2020-03-25 MED ORDER — METFORMIN HCL 500 MG PO TABS: ORAL_TABLET | ORAL | 1 refills | Status: DC

## 2020-03-25 MED ORDER — DAPAGLIFLOZIN PROPANEDIOL 10 MG PO TABS: ORAL_TABLET | ORAL | 0 refills | Status: DC

## 2020-03-25 NOTE — Progress Notes (Signed)
Patient ID: Victor Gonzales, male    DOB: 06/04/1960, 59 y.o.   MRN: [id number]   Chief Complaint  Patient presents with  . Hypertension  . Diabetes  . Hyperlipidemia   Subjective:    HPI  F/u on htn, DM2, HLD-  Recent Labs- glucose 187,  ast and alt elevated 54, 89. Increased from last time in 1/21. a1c 8.7.  In 4/21 - 5.8.  Cholesterol normal range.  Having change in weather with runny nose.  Feeling well and sleeping well.   DM2- Pt stating last visit, his metformin dec to 500mg  at night. Pt is more active in spring and summer.  Not as active in fall.  Not walking as much.  Seen by Dr. 03-20-1976 in past GI, and biopsy and had fatty liver disease.  Last u/s abd 2010. biopsy liver 2005. colonoscipy in 2025 due.  htn- pt stating he's compliant with meds. Taking all 3 in am for his bp meds. Mostly around 8-9am. No chest pain, ha, sob, leg swelling, or sob.  Eye exam last month normal per pt.  Medical History Victor Gonzales has a past medical history of Diabetes mellitus without complication (HCC), GERD (gastroesophageal reflux disease), Hyperlipidemia, and Hypertension.   Outpatient Encounter Medications as of 03/25/2020  Medication Sig  . amLODipine (NORVASC) 10 MG tablet Take 1 tablet (10 mg total) by mouth daily.  13/05/2019 aspirin 81 MG tablet Take 81 mg by mouth. Takes one every other day  . bisoprolol-hydrochlorothiazide (ZIAC) 10-6.25 MG tablet Take 1 tablet by mouth daily.  . dapagliflozin propanediol (FARXIGA) 10 MG TABS tablet Take one tablet by mouth daily  . glimepiride (AMARYL) 4 MG tablet Take 2 tablets (8 mg total) by mouth daily.  12-16-1995 Hyoscyamine Sulfate SL (LEVSIN/SL) 0.125 MG SUBL One every 6 hours prn cramping (Patient not taking: Reported on 03/25/2020)  . metFORMIN (GLUCOPHAGE) 500 MG tablet TAKE 2 TABLETS BY MOUTH  TWICE A DAY  . Multiple Vitamin (MULTIVITAMIN) tablet Take 1 tablet by mouth daily.  . ramipril (ALTACE) 10 MG capsule Take 2 capsules (20 mg total) by  mouth daily.  . [DISCONTINUED] amLODipine (NORVASC) 10 MG tablet Take 1 tablet (10 mg total) by mouth daily.  . [DISCONTINUED] bisoprolol-hydrochlorothiazide (ZIAC) 10-6.25 MG tablet Take 1 tablet by mouth daily.  . [DISCONTINUED] dapagliflozin propanediol (FARXIGA) 10 MG TABS tablet Take one tablet by mouth daily  . [DISCONTINUED] glimepiride (AMARYL) 4 MG tablet Take 2 tablets (8 mg total) by mouth daily.  . [DISCONTINUED] metFORMIN (GLUCOPHAGE) 500 MG tablet TAKE 2 TABLETS BY MOUTH  TWICE A DAY  . [DISCONTINUED] ramipril (ALTACE) 10 MG capsule Take 2 capsules (20 mg total) by mouth daily.   No facility-administered encounter medications on file as of 03/25/2020.     Review of Systems  Constitutional: Negative for chills and fever.  HENT: Negative for congestion, rhinorrhea and sore throat.   Respiratory: Negative for cough, shortness of breath and wheezing.   Cardiovascular: Negative for chest pain and leg swelling.  Gastrointestinal: Negative for abdominal pain, diarrhea, nausea and vomiting.  Genitourinary: Negative for dysuria and frequency.  Skin: Negative for rash.  Neurological: Negative for dizziness, weakness and headaches.     Vitals BP (!) 154/88   Pulse 77   Temp 98.2 F (36.8 C)   Wt 156 lb 3.2 oz (70.9 kg)   SpO2 98%   BMI 24.46 kg/m   Objective:   Physical Exam Vitals and nursing note reviewed.  Constitutional:  General: He is not in acute distress.    Appearance: Normal appearance. He is not ill-appearing.  HENT:     Head: Normocephalic.     Nose: Nose normal. No congestion.     Mouth/Throat:     Mouth: Mucous membranes are moist.     Pharynx: No oropharyngeal exudate.  Eyes:     Extraocular Movements: Extraocular movements intact.     Conjunctiva/sclera: Conjunctivae normal.     Pupils: Pupils are equal, round, and reactive to light.  Cardiovascular:     Rate and Rhythm: Normal rate and regular rhythm.     Pulses: Normal pulses.     Heart  sounds: Normal heart sounds. No murmur heard.   Pulmonary:     Effort: Pulmonary effort is normal.     Breath sounds: Normal breath sounds. No wheezing, rhonchi or rales.  Musculoskeletal:        General: Normal range of motion.     Right lower leg: No edema.     Left lower leg: No edema.  Skin:    General: Skin is warm and dry.     Findings: No rash.  Neurological:     General: No focal deficit present.     Mental Status: He is alert and oriented to person, place, and time.     Cranial Nerves: No cranial nerve deficit.  Psychiatric:        Mood and Affect: Mood normal.        Behavior: Behavior normal.        Thought Content: Thought content normal.        Judgment: Judgment normal.      Assessment and Plan   1. Type 2 diabetes mellitus with diabetic neuropathy, without long-term current use of insulin (HCC)  2. Fatty liver  3. Primary hypertension  4. Need for vaccination - Flu Vaccine QUAD 6+ mos PF IM (Fluarix Quad PF)  5. Hyperlipidemia LDL goal <100   Fatty liver and elevated LFTs- Cont to monitor LFTs.   DM2- uncontrolled.  a1c inc to 8.7, was at 5.3 on last visit. work on [employer] and go back up to 2x per day 1000mg  metformin. Cont to exercise.  htn- suboptimal-  Pt blood pressure still elevated at 150s systolic on recheck.  Pt to f/u in 1-2 wks for nurse visit to recheck bp.  Watch salt intake.  HLD- stable, normal. Cont to monitor and diet modification.  F/u 42mo or prn.

## 2020-03-25 NOTE — Patient Instructions (Signed)
Fatty Liver Disease  Fatty liver disease occurs when too much fat has built up in your liver cells. Fatty liver disease is also called hepatic steatosis or steatohepatitis. The liver removes harmful substances from your bloodstream and produces fluids that your body needs. It also helps your body use and store energy from the food you eat. In many cases, fatty liver disease does not cause symptoms or problems. It is often diagnosed when tests are being done for other reasons. However, over time, fatty liver can cause inflammation that may lead to more serious liver problems, such as scarring of the liver (cirrhosis) and liver failure. Fatty liver is associated with insulin resistance, increased body fat, high blood pressure (hypertension), and high cholesterol. These are features of metabolic syndrome and increase your risk for stroke, diabetes, and heart disease. What are the causes? This condition may be caused by:  Drinking too much alcohol.  Poor nutrition.  Obesity.  Cushing's syndrome.  Diabetes.  High cholesterol.  Certain drugs.  Poisons.  Some viral infections.  Pregnancy. What increases the risk? You are more likely to develop this condition if you:  Abuse alcohol.  Are overweight.  Have diabetes.  Have hepatitis.  Have a high triglyceride level.  Are pregnant. What are the signs or symptoms? Fatty liver disease often does not cause symptoms. If symptoms do develop, they can include:  Fatigue.  Weakness.  Weight loss.  Confusion.  Abdominal pain.  Nausea and vomiting.  Yellowing of your skin and the white parts of your eyes (jaundice).  Itchy skin. How is this diagnosed? This condition may be diagnosed by:  A physical exam and medical history.  Blood tests.  Imaging tests, such as an ultrasound, CT scan, or MRI.  A liver biopsy. A small sample of liver tissue is removed using a needle. The sample is then looked at under a microscope. How  is this treated? Fatty liver disease is often caused by other health conditions. Treatment for fatty liver may involve medicines and lifestyle changes to manage conditions such as:  Alcoholism.  High cholesterol.  Diabetes.  Being overweight or obese. Follow these instructions at home:   Do not drink alcohol. If you have trouble quitting, ask your health care provider how to safely quit with the help of medicine or a supervised program. This is important to keep your condition from getting worse.  Eat a healthy diet as told by your health care provider. Ask your health care provider about working with a diet and nutrition specialist (dietitian) to develop an eating plan.  Exercise regularly. This can help you lose weight and control your cholesterol and diabetes. Talk to your health care provider about an exercise plan and which activities are best for you.  Take over-the-counter and prescription medicines only as told by your health care provider.  Keep all follow-up visits as told by your health care provider. This is important. Contact a health care provider if: You have trouble controlling your:  Blood sugar. This is especially important if you have diabetes.  Cholesterol.  Drinking of alcohol. Get help right away if:  You have abdominal pain.  You have jaundice.  You have nausea and vomiting.  You vomit blood or material that looks like coffee grounds.  You have stools that are black, tar-like, or bloody. Summary  Fatty liver disease develops when too much fat builds up in the cells of your liver.  Fatty liver disease often causes no symptoms or problems. However, over   time, fatty liver can cause inflammation that may lead to more serious liver problems, such as scarring of the liver (cirrhosis).  You are more likely to develop this condition if you abuse alcohol, are pregnant, are overweight, have diabetes, have hepatitis, or have high triglyceride  levels.  Contact your health care provider if you have trouble controlling your weight, blood sugar, cholesterol, or drinking of alcohol. This information is not intended to replace advice given to you by your health care provider. Make sure you discuss any questions you have with your health care provider. Document Revised: 04/23/2017 Document Reviewed: 02/17/2017 Elsevier Patient Education  2020 Elsevier Inc.  

## 2020-04-23 ENCOUNTER — Other Ambulatory Visit: Payer: Federal, State, Local not specified - PPO

## 2020-04-26 ENCOUNTER — Other Ambulatory Visit: Payer: Self-pay | Admitting: Family Medicine

## 2020-05-28 ENCOUNTER — Other Ambulatory Visit: Payer: Self-pay | Admitting: Family Medicine

## 2020-06-12 ENCOUNTER — Telehealth: Payer: Self-pay | Admitting: Family Medicine

## 2020-06-12 MED ORDER — GLIMEPIRIDE 4 MG PO TABS: 8.0000 mg | ORAL_TABLET | Freq: Every day | ORAL | 0 refills | Status: DC

## 2020-06-12 NOTE — Telephone Encounter (Signed)
Pharmacy requesting refill on Glimepiride 4 mg tablets. Take 2 tablets po daily. Pt last seen 03/25/20. Please advise. Thank you

## 2020-06-12 NOTE — Addendum Note (Signed)
Addended by: Annalee Genta on: 06/12/2020 04:57 PM   Modules accepted: Orders

## 2020-07-18 ENCOUNTER — Other Ambulatory Visit: Payer: Self-pay | Admitting: Family Medicine

## 2020-07-30 ENCOUNTER — Telehealth: Payer: Self-pay

## 2020-07-30 DIAGNOSIS — E1140 Type 2 diabetes mellitus with diabetic neuropathy, unspecified: Secondary | ICD-10-CM

## 2020-07-30 DIAGNOSIS — I10 Essential (primary) hypertension: Secondary | ICD-10-CM

## 2020-07-30 DIAGNOSIS — E785 Hyperlipidemia, unspecified: Secondary | ICD-10-CM

## 2020-07-30 DIAGNOSIS — Z79899 Other long term (current) drug therapy: Secondary | ICD-10-CM

## 2020-07-30 NOTE — Telephone Encounter (Signed)
Left message to return call. Lab orders placed 

## 2020-07-30 NOTE — Telephone Encounter (Signed)
Pt made Med Check appt for 03/22 and needs to know if needs blood work done?  Also Pt dropped off form placed in Dr Ladona Ridgel folder Glori Bickers 10 mg or Tabs that pt is on exp-ires 09/05/2020 and will need another pre approval on insurance. Information in folder.  Pt call back [phone] or [phone] wife would be better to contact

## 2020-07-30 NOTE — Telephone Encounter (Signed)
Yes , pls order those labs. Dr. Ladona Ridgel

## 2020-07-30 NOTE — Telephone Encounter (Signed)
Last labs completed 03/18/20 A1C, Urine Micro, Lipid, CMP, CBC. Please advise. Thank you.   If med need pre cert and it could be done through cover my meds, nurses will do.  Wife is not on DPR.

## 2020-07-31 NOTE — Telephone Encounter (Signed)
Pt returned call and verbalized understanding  

## 2020-08-01 DIAGNOSIS — Z79899 Other long term (current) drug therapy: Secondary | ICD-10-CM | POA: Diagnosis not present

## 2020-08-01 DIAGNOSIS — E785 Hyperlipidemia, unspecified: Secondary | ICD-10-CM | POA: Diagnosis not present

## 2020-08-01 DIAGNOSIS — I10 Essential (primary) hypertension: Secondary | ICD-10-CM | POA: Diagnosis not present

## 2020-08-01 DIAGNOSIS — E1140 Type 2 diabetes mellitus with diabetic neuropathy, unspecified: Secondary | ICD-10-CM | POA: Diagnosis not present

## 2020-08-02 LAB — COMPREHENSIVE METABOLIC PANEL
ALT: 78 IU/L — ABNORMAL HIGH (ref 0–44)
AST: 65 IU/L — ABNORMAL HIGH (ref 0–40)
Albumin/Globulin Ratio: 1.9 (ref 1.2–2.2)
Albumin: 4.7 g/dL (ref 3.8–4.9)
Alkaline Phosphatase: 65 IU/L (ref 44–121)
BUN/Creatinine Ratio: 14 (ref 10–24)
BUN: 13 mg/dL (ref 8–27)
Bilirubin Total: 0.8 mg/dL (ref 0.0–1.2)
CO2: 22 mmol/L (ref 20–29)
Calcium: 9.6 mg/dL (ref 8.6–10.2)
Chloride: 104 mmol/L (ref 96–106)
Creatinine, Ser: 0.93 mg/dL (ref 0.76–1.27)
Globulin, Total: 2.5 g/dL (ref 1.5–4.5)
Glucose: 134 mg/dL — ABNORMAL HIGH (ref 65–99)
Potassium: 4.1 mmol/L (ref 3.5–5.2)
Sodium: 143 mmol/L (ref 134–144)
Total Protein: 7.2 g/dL (ref 6.0–8.5)
eGFR: 94 mL/min/{1.73_m2} (ref >59)

## 2020-08-02 LAB — CBC WITH DIFFERENTIAL/PLATELET
Basophils Absolute: 0 10*3/uL (ref 0.0–0.2)
Basos: 1 %
EOS (ABSOLUTE): 0.4 10*3/uL (ref 0.0–0.4)
Eos: 7 %
Hematocrit: 51.4 % — ABNORMAL HIGH (ref 37.5–51.0)
Hemoglobin: 17.2 g/dL (ref 13.0–17.7)
Immature Grans (Abs): 0 10*3/uL (ref 0.0–0.1)
Immature Granulocytes: 0 %
Lymphocytes Absolute: 2.5 10*3/uL (ref 0.7–3.1)
Lymphs: 40 %
MCH: 29 pg (ref 26.6–33.0)
MCHC: 33.5 g/dL (ref 31.5–35.7)
MCV: 87 fL (ref 79–97)
Monocytes Absolute: 0.6 10*3/uL (ref 0.1–0.9)
Monocytes: 10 %
Neutrophils Absolute: 2.7 10*3/uL (ref 1.4–7.0)
Neutrophils: 42 %
Platelets: 150 10*3/uL (ref 150–450)
RBC: 5.93 x10E6/uL — ABNORMAL HIGH (ref 4.14–5.80)
RDW: 13.1 % (ref 11.6–15.4)
WBC: 6.3 10*3/uL (ref 3.4–10.8)

## 2020-08-02 LAB — HEMOGLOBIN A1C
Est. average glucose Bld gHb Est-mCnc: 169 mg/dL
Hgb A1c MFr Bld: 7.5 % — ABNORMAL HIGH (ref 4.8–5.6)

## 2020-08-02 LAB — LIPID PANEL
Chol/HDL Ratio: 3.5 ratio (ref 0.0–5.0)
Cholesterol, Total: 174 mg/dL (ref 100–199)
HDL: 50 mg/dL (ref >39)
LDL Chol Calc (NIH): 106 mg/dL — ABNORMAL HIGH (ref 0–99)
Triglycerides: 98 mg/dL (ref 0–149)
VLDL Cholesterol Cal: 18 mg/dL (ref 5–40)

## 2020-08-02 LAB — MICROALBUMIN / CREATININE URINE RATIO
Creatinine, Urine: 151.6 mg/dL
Microalb/Creat Ratio: 20 mg/g creat (ref 0–29)
Microalbumin, Urine: 29.8 ug/mL

## 2020-08-07 DIAGNOSIS — C44329 Squamous cell carcinoma of skin of other parts of face: Secondary | ICD-10-CM | POA: Diagnosis not present

## 2020-08-07 DIAGNOSIS — X32XXXA Exposure to sunlight, initial encounter: Secondary | ICD-10-CM | POA: Diagnosis not present

## 2020-08-07 DIAGNOSIS — L570 Actinic keratosis: Secondary | ICD-10-CM | POA: Diagnosis not present

## 2020-08-13 ENCOUNTER — Encounter: Payer: Federal, State, Local not specified - PPO | Admitting: Family Medicine

## 2020-08-13 ENCOUNTER — Other Ambulatory Visit: Payer: Self-pay

## 2020-08-13 ENCOUNTER — Ambulatory Visit: Payer: Federal, State, Local not specified - PPO | Admitting: Family Medicine

## 2020-08-13 MED ORDER — BISOPROLOL-HYDROCHLOROTHIAZIDE 10-6.25 MG PO TABS: 1.0000 | ORAL_TABLET | Freq: Every day | ORAL | 0 refills | Status: DC

## 2020-08-13 MED ORDER — DAPAGLIFLOZIN PROPANEDIOL 10 MG PO TABS: ORAL_TABLET | ORAL | 0 refills | Status: DC

## 2020-08-13 MED ORDER — GLIMEPIRIDE 4 MG PO TABS: 8.0000 mg | ORAL_TABLET | Freq: Every day | ORAL | 0 refills | Status: DC

## 2020-08-13 MED ORDER — METFORMIN HCL 500 MG PO TABS: 1000.0000 mg | ORAL_TABLET | Freq: Two times a day (BID) | ORAL | 1 refills | Status: DC

## 2020-08-13 MED ORDER — AMLODIPINE BESYLATE 10 MG PO TABS: 10.0000 mg | ORAL_TABLET | Freq: Every day | ORAL | 0 refills | Status: DC

## 2020-08-22 ENCOUNTER — Encounter: Payer: Self-pay | Admitting: Family Medicine

## 2020-08-22 ENCOUNTER — Other Ambulatory Visit: Payer: Self-pay

## 2020-08-22 ENCOUNTER — Ambulatory Visit: Payer: Federal, State, Local not specified - PPO | Admitting: Family Medicine

## 2020-08-22 VITALS — BP 138/78 | HR 73 | Temp 96.6°F | Wt 158.4 lb

## 2020-08-22 DIAGNOSIS — I10 Essential (primary) hypertension: Secondary | ICD-10-CM | POA: Diagnosis not present

## 2020-08-22 DIAGNOSIS — E1140 Type 2 diabetes mellitus with diabetic neuropathy, unspecified: Secondary | ICD-10-CM

## 2020-08-22 NOTE — Progress Notes (Signed)
Pt here for med check/follow up on diabetes. Pt states no issues. Taking all meds as prescribed. Checking sugars at least 3 times a week. Sugars have been between 120-130s. Pt states does not check blood pressure often but has been feeling fine.  Pt would like refill on Accu Check Guide Me strips.    Patient ID: Victor Gonzales, male    DOB: 1961/04/17, 60 y.o.   MRN: [id number]   Chief Complaint  Patient presents with  . Diabetes   Subjective:  CC: Medication management for hypertension and diabetes  Presents today for medication management for hypertension and diabetes.  Reports that he is compliant with his medications, does not have any side effects.  Reports that he takes his blood sugars approximately 3 times per week, does not routinely take his blood pressure.  Has a history of elevated liver enzymes, has had ultrasound and seen GI in the past has known fatty liver disease.  Reports that he does not drink alcohol and rarely eats red meat.  Denies fever, chills, chest pain, shortness of breath, lower extremity edema, vision changes, headaches.    Medical History Caedon has a past medical history of Diabetes mellitus without complication (HCC), GERD (gastroesophageal reflux disease), Hyperlipidemia, and Hypertension.   Outpatient Encounter Medications as of 08/22/2020  Medication Sig  . amLODipine (NORVASC) 10 MG tablet Take 1 tablet (10 mg total) by mouth daily.  Marland Kitchen aspirin 81 MG tablet Take 81 mg by mouth. Takes one every other day  . bisoprolol-hydrochlorothiazide (ZIAC) 10-6.25 MG tablet Take 1 tablet by mouth daily.  . dapagliflozin propanediol (FARXIGA) 10 MG TABS tablet Take one tablet by mouth daily  . glimepiride (AMARYL) 4 MG tablet Take 2 tablets (8 mg total) by mouth daily.  . metFORMIN (GLUCOPHAGE) 500 MG tablet Take 2 tablets (1,000 mg total) by mouth 2 (two) times daily.  . Multiple Vitamin (MULTIVITAMIN) tablet Take 1 tablet by mouth daily.  . ramipril (ALTACE) 10 MG  capsule TAKE 2 CAPSULES(20 MG) BY MOUTH DAILY  . [DISCONTINUED] Hyoscyamine Sulfate SL (LEVSIN/SL) 0.125 MG SUBL One every 6 hours prn cramping (Patient not taking: Reported on 03/25/2020)   No facility-administered encounter medications on file as of 08/22/2020.     Review of Systems  Constitutional: Negative for chills and fever.  Eyes: Negative for visual disturbance.  Respiratory: Negative for shortness of breath.   Cardiovascular: Negative for chest pain, palpitations and leg swelling.  Musculoskeletal: Negative for myalgias.  Neurological: Negative for headaches.     Vitals BP 138/78   Pulse 73   Temp (!) 96.6 F (35.9 C)   Wt 158 lb 6.4 oz (71.8 kg)   SpO2 96%   BMI 24.81 kg/m   Objective:   Physical Exam Vitals reviewed.  Cardiovascular:     Rate and Rhythm: Normal rate and regular rhythm.     Heart sounds: Normal heart sounds.  Pulmonary:     Effort: Pulmonary effort is normal.     Breath sounds: Normal breath sounds.  Skin:    General: Skin is warm and dry.  Neurological:     General: No focal deficit present.     Mental Status: He is alert.  Psychiatric:        Behavior: Behavior normal.      Assessment and Plan   1. Primary hypertension  2. Type 2 diabetes mellitus with diabetic neuropathy, without long-term current use of insulin (HCC)    Hypertension Medication compliance: takes as prescribed.  Denies chest pain, shortness of breath, lower extremity edema, vision changes, headaches.  Pertinent lab work: last labs on  3-10: reviewed in detailed.  Kidney functions within normal limits, liver enzymes elevated, history of fatty liver. Monitoring: rarely checks blood pressure, every 6 months with labs  Side effects: none Continue current medication regimen: Continue all medications as prescribed.  Diabetes: Medication compliance: takes as prescribed Denies chest pain, shortness of breath, vision changes, polydipsia, polyphagia,  polyuria Pertinent lab work: reviewed in detail today.  A1c 7.5, down from 8.7. Monitoring: blood sugar readings at home:  3 times per week (fasting)   Continue current medication regimen: Continue all medications as prescribed. Last dilated eye exam: due for eye exam in september   Agrees with plan of care discussed today. Understands warning signs to seek further care: chest pain, shortness of breath, any significant change in health.  Understands to follow-up in 6 months for medication management for HTN and DM.  Instructed to take his blood pressure a few times per week, notify if it is not returning to less than 130/80.  Information given on the Dash diet, encouraged to watch sodium intake, and to continue walking.  No refills needed today.  Prescription written for Accu check guide to me strips.   Novella Olive, NP 08/22/2020

## 2020-08-22 NOTE — Patient Instructions (Addendum)
Take your blood pressure a few times per week and let us know if it continues to be > 130/80. Let us know before 6 months if blood pressure does not return to normal.     DASH Eating Plan DASH stands for Dietary Approaches to Stop Hypertension. The DASH eating plan is a healthy eating plan that has been shown to:  Reduce high blood pressure (hypertension).  Reduce your risk for type 2 diabetes, heart disease, and stroke.  Help with weight loss. What are tips for following this plan? Reading food labels  Check food labels for the amount of salt (sodium) per serving. Choose foods with less than 5 percent of the Daily Value of sodium. Generally, foods with less than 300 milligrams (mg) of sodium per serving fit into this eating plan.  To find whole grains, look for the word "whole" as the first word in the ingredient list. Shopping  Buy products labeled as "low-sodium" or "no salt added."  Buy fresh foods. Avoid canned foods and pre-made or frozen meals. Cooking  Avoid adding salt when cooking. Use salt-free seasonings or herbs instead of table salt or sea salt. Check with your health care provider or pharmacist before using salt substitutes.  Do not fry foods. Cook foods using healthy methods such as baking, boiling, grilling, roasting, and broiling instead.  Cook with heart-healthy oils, such as olive, canola, avocado, soybean, or sunflower oil. Meal planning  Eat a balanced diet that includes: ? 4 or more servings of fruits and 4 or more servings of vegetables each day. Try to fill one-half of your plate with fruits and vegetables. ? 6-8 servings of whole grains each day. ? Less than 6 oz (170 g) of lean meat, poultry, or fish each day. A 3-oz (85-g) serving of meat is about the same size as a deck of cards. One egg equals 1 oz (28 g). ? 2-3 servings of low-fat dairy each day. One serving is 1 cup (237 mL). ? 1 serving of nuts, seeds, or beans 5 times each week. ? 2-3 servings  of heart-healthy fats. Healthy fats called omega-3 fatty acids are found in foods such as walnuts, flaxseeds, fortified milks, and eggs. These fats are also found in cold-water fish, such as sardines, salmon, and mackerel.  Limit how much you eat of: ? Canned or prepackaged foods. ? Food that is high in trans fat, such as some fried foods. ? Food that is high in saturated fat, such as fatty meat. ? Desserts and other sweets, sugary drinks, and other foods with added sugar. ? Full-fat dairy products.  Do not salt foods before eating.  Do not eat more than 4 egg yolks a week.  Try to eat at least 2 vegetarian meals a week.  Eat more home-cooked food and less restaurant, buffet, and fast food.   Lifestyle  When eating at a restaurant, ask that your food be prepared with less salt or no salt, if possible.  If you drink alcohol: ? Limit how much you use to:  0-1 drink a day for women who are not pregnant.  0-2 drinks a day for men. ? Be aware of how much alcohol is in your drink. In the [place] one drink equals one 12 oz bottle of beer (355 mL), one 5 oz glass of wine (148 mL), or one 1 oz glass of hard liquor (44 mL). General information  Avoid eating more than 2,300 mg of salt a day. If you have hypertension, you  may need to reduce your sodium intake to 1,500 mg a day.  Work with your health care provider to maintain a healthy body weight or to lose weight. Ask what an ideal weight is for you.  Get at least 30 minutes of exercise that causes your heart to beat faster (aerobic exercise) most days of the week. Activities may include walking, swimming, or biking.  Work with your health care provider or dietitian to adjust your eating plan to your individual calorie needs. What foods should I eat? Fruits All fresh, dried, or frozen fruit. Canned fruit in natural juice (without added sugar). Vegetables Fresh or frozen vegetables (raw, steamed, roasted, or grilled). Low-sodium or  reduced-sodium tomato and vegetable juice. Low-sodium or reduced-sodium tomato sauce and tomato paste. Low-sodium or reduced-sodium canned vegetables. Grains Whole-grain or whole-wheat bread. Whole-grain or whole-wheat pasta. Brown rice. Orpah Cobbatmeal. Quinoa. Bulgur. Whole-grain and low-sodium cereals. Pita bread. Low-fat, low-sodium crackers. Whole-wheat flour tortillas. Meats and other proteins Skinless chicken or [place]urkey. Ground chicken or [place]key. Pork with fat trimmed off. Fish and seafood. Egg whites. Dried beans, peas, or lentils. Unsalted nuts, nut butters, and seeds. Unsalted canned beans. Lean cuts of beef with fat trimmed off. Low-sodium, lean precooked or cured meat, such as sausages or meat loaves. Dairy Low-fat (1%) or fat-free (skim) milk. Reduced-fat, low-fat, or fat-free cheeses. Nonfat, low-sodium ricotta or cottage cheese. Low-fat or nonfat yogurt. Low-fat, low-sodium cheese. Fats and oils Soft margarine without trans fats. Vegetable oil. Reduced-fat, low-fat, or light mayonnaise and salad dressings (reduced-sodium). Canola, safflower, olive, avocado, soybean, and sunflower oils. Avocado. Seasonings and condiments Herbs. Spices. Seasoning mixes without salt. Other foods Unsalted popcorn and pretzels. Fat-free sweets. The items listed above may not be a complete list of foods and beverages you can eat. Contact a dietitian for more information. What foods should I avoid? Fruits Canned fruit in a light or heavy syrup. Fried fruit. Fruit in cream or butter sauce. Vegetables Creamed or fried vegetables. Vegetables in a cheese sauce. Regular canned vegetables (not low-sodium or reduced-sodium). Regular canned tomato sauce and paste (not low-sodium or reduced-sodium). Regular tomato and vegetable juice (not low-sodium or reduced-sodium). Rosita FirePickles. Olives. Grains Baked goods made with fat, such as croissants, muffins, or some breads. Dry pasta or rice meal packs. Meats and other  proteins Fatty cuts of meat. Ribs. Fried meat. Tomasa BlaseBacon. Bologna, salami, and other precooked or cured meats, such as sausages or meat loaves. Fat from the back of a pig (fatback). Bratwurst. Salted nuts and seeds. Canned beans with added salt. Canned or smoked fish. Whole eggs or egg yolks. Chicken or [place]key with skin. Dairy Whole or 2% milk, cream, and half-and-half. Whole or full-fat cream cheese. Whole-fat or sweetened yogurt. Full-fat cheese. Nondairy creamers. Whipped toppings. Processed cheese and cheese spreads. Fats and oils Butter. Stick margarine. Lard. Shortening. Ghee. Bacon fat. Tropical oils, such as coconut, palm kernel, or palm oil. Seasonings and condiments Onion salt, garlic salt, seasoned salt, table salt, and sea salt. Worcestershire sauce. Tartar sauce. Barbecue sauce. Teriyaki sauce. Soy sauce, including reduced-sodium. Steak sauce. Canned and packaged gravies. Fish sauce. Oyster sauce. Cocktail sauce. Store-bought horseradish. Ketchup. Mustard. Meat flavorings and tenderizers. Bouillon cubes. Hot sauces. Pre-made or packaged marinades. Pre-made or packaged taco seasonings. Relishes. Regular salad dressings. Other foods Salted popcorn and pretzels. The items listed above may not be a complete list of foods and beverages you should avoid. Contact a dietitian for more information. Where to find more information  National Heart, Lung, and  Blood Institute: PopSteam.is  American Heart Association: www.heart.org  Academy of Nutrition and Dietetics: www.eatright.org  National Kidney Foundation: www.kidney.org Summary  The DASH eating plan is a healthy eating plan that has been shown to reduce high blood pressure (hypertension). It may also reduce your risk for type 2 diabetes, heart disease, and stroke.  When on the DASH eating plan, aim to eat more fresh fruits and vegetables, whole grains, lean proteins, low-fat dairy, and heart-healthy fats.  With the DASH eating plan,  you should limit salt (sodium) intake to 2,300 mg a day. If you have hypertension, you may need to reduce your sodium intake to 1,500 mg a day.  Work with your health care provider or dietitian to adjust your eating plan to your individual calorie needs. This information is not intended to replace advice given to you by your health care provider. Make sure you discuss any questions you have with your health care provider. Document Revised: 04/14/2019 Document Reviewed: 04/14/2019 Elsevier Patient Education  2021 Elsevier Inc.      Hypertension, Adult Hypertension is another name for high blood pressure. High blood pressure forces your heart to work harder to pump blood. This can cause problems over time. There are two numbers in a blood pressure reading. There is a top number (systolic) over a bottom number (diastolic). It is best to have a blood pressure that is below 120/80. Healthy choices can help lower your blood pressure, or you may need medicine to help lower it. What are the causes? The cause of this condition is not known. Some conditions may be related to high blood pressure. What increases the risk?  Smoking.  Having type 2 diabetes mellitus, high cholesterol, or both.  Not getting enough exercise or physical activity.  Being overweight.  Having too much fat, sugar, calories, or salt (sodium) in your diet.  Drinking too much alcohol.  Having long-term (chronic) kidney disease.  Having a family history of high blood pressure.  Age. Risk increases with age.  Race. You may be at higher risk if you are African American.  Gender. Men are at higher risk than women before age 43. After age 32, women are at higher risk than men.  Having obstructive sleep apnea.  Stress. What are the signs or symptoms?  High blood pressure may not cause symptoms. Very high blood pressure (hypertensive crisis) may cause: ? Headache. ? Feelings of worry or nervousness  (anxiety). ? Shortness of breath. ? Nosebleed. ? A feeling of being sick to your stomach (nausea). ? Throwing up (vomiting). ? Changes in how you see. ? Very bad chest pain. ? Seizures. How is this treated?  This condition is treated by making healthy lifestyle changes, such as: ? Eating healthy foods. ? Exercising more. ? Drinking less alcohol.  Your health care provider may prescribe medicine if lifestyle changes are not enough to get your blood pressure under control, and if: ? Your top number is above 130. ? Your bottom number is above 80.  Your personal target blood pressure may vary. Follow these instructions at home: Eating and drinking  If told, follow the DASH eating plan. To follow this plan: ? Fill one half of your plate at each meal with fruits and vegetables. ? Fill one fourth of your plate at each meal with whole grains. Whole grains include whole-wheat pasta, brown rice, and whole-grain bread. ? Eat or drink low-fat dairy products, such as skim milk or low-fat yogurt. ? Fill one fourth of your  plate at each meal with low-fat (lean) proteins. Low-fat proteins include fish, chicken without skin, eggs, beans, and tofu. ? Avoid fatty meat, cured and processed meat, or chicken with skin. ? Avoid pre-made or processed food.  Eat less than 1,500 mg of salt each day.  Do not drink alcohol if: ? Your doctor tells you not to drink. ? You are pregnant, may be pregnant, or are planning to become pregnant.  If you drink alcohol: ? Limit how much you use to:  0-1 drink a day for women.  0-2 drinks a day for men. ? Be aware of how much alcohol is in your drink. In the [place] one drink equals one 12 oz bottle of beer (355 mL), one 5 oz glass of wine (148 mL), or one 1 oz glass of hard liquor (44 mL).   Lifestyle  Work with your doctor to stay at a healthy weight or to lose weight. Ask your doctor what the best weight is for you.  Get at least 30 minutes of exercise most  days of the week. This may include walking, swimming, or biking.  Get at least 30 minutes of exercise that strengthens your muscles (resistance exercise) at least 3 days a week. This may include lifting weights or doing Pilates.  Do not use any products that contain nicotine or tobacco, such as cigarettes, e-cigarettes, and chewing tobacco. If you need help quitting, ask your doctor.  Check your blood pressure at home as told by your doctor.  Keep all follow-up visits as told by your doctor. This is important.   Medicines  Take over-the-counter and prescription medicines only as told by your doctor. Follow directions carefully.  Do not skip doses of blood pressure medicine. The medicine does not work as well if you skip doses. Skipping doses also puts you at risk for problems.  Ask your doctor about side effects or reactions to medicines that you should watch for. Contact a doctor if you:  Think you are having a reaction to the medicine you are taking.  Have headaches that keep coming back (recurring).  Feel dizzy.  Have swelling in your ankles.  Have trouble with your vision. Get help right away if you:  Get a very bad headache.  Start to feel mixed up (confused).  Feel weak or numb.  Feel faint.  Have very bad pain in your: ? Chest. ? Belly (abdomen).  Throw up more than once.  Have trouble breathing. Summary  Hypertension is another name for high blood pressure.  High blood pressure forces your heart to work harder to pump blood.  For most people, a normal blood pressure is less than 120/80.  Making healthy choices can help lower blood pressure. If your blood pressure does not get lower with healthy choices, you may need to take medicine. This information is not intended to replace advice given to you by your health care provider. Make sure you discuss any questions you have with your health care provider. Document Revised: 01/19/2018 Document Reviewed:  01/19/2018 Elsevier Patient Education  2021 [employer].

## 2020-09-10 ENCOUNTER — Other Ambulatory Visit: Payer: Self-pay | Admitting: Family Medicine

## 2020-09-11 ENCOUNTER — Other Ambulatory Visit: Payer: Self-pay | Admitting: Family Medicine

## 2020-10-09 ENCOUNTER — Other Ambulatory Visit: Payer: Self-pay | Admitting: Family Medicine

## 2020-11-08 ENCOUNTER — Other Ambulatory Visit: Payer: Self-pay | Admitting: *Deleted

## 2020-11-08 MED ORDER — BISOPROLOL-HYDROCHLOROTHIAZIDE 10-6.25 MG PO TABS: 1.0000 | ORAL_TABLET | Freq: Every day | ORAL | 0 refills | Status: DC

## 2020-12-16 ENCOUNTER — Ambulatory Visit: Payer: Federal, State, Local not specified - PPO | Admitting: Family Medicine

## 2020-12-16 ENCOUNTER — Encounter: Payer: Self-pay | Admitting: Family Medicine

## 2020-12-16 ENCOUNTER — Other Ambulatory Visit: Payer: Self-pay

## 2020-12-16 VITALS — BP 162/80 | HR 70 | Temp 97.0°F | Ht 67.0 in | Wt 153.0 lb

## 2020-12-16 DIAGNOSIS — R1013 Epigastric pain: Secondary | ICD-10-CM

## 2020-12-16 DIAGNOSIS — M549 Dorsalgia, unspecified: Secondary | ICD-10-CM

## 2020-12-16 MED ORDER — ESOMEPRAZOLE MAGNESIUM 40 MG PO CPDR: 40.0000 mg | DELAYED_RELEASE_CAPSULE | Freq: Every day | ORAL | 1 refills | Status: DC

## 2020-12-16 NOTE — Patient Instructions (Addendum)
Take tylenol for pain. Don't take advil or naprosyn or aspirin.  Avoid greasy, spicy, caffeine, alcohol, or citrus or tomato based.  Tums or mylanta during the day/night as needed.  Take nexium daily. Okay to take zantac during day also with the nexium.

## 2020-12-16 NOTE — Progress Notes (Signed)
Patient ID: Victor Gonzales, male    DOB: [birth year], 60 y.o.   MRN: [id number]   Chief Complaint  Patient presents with   abdominal pain x 3 week     Mid abd area x 3 week taking mylanta and advil for low back pain    Subjective:    HPI 3 weeks- started with constipation.  Then had bm then pain got slight better. Pain in mid abd area. Then returned again. Then last 2 wks having mid back pain. Has been taking advil.   Eating lots of tomatoes.  Epigastic pain,  Back pain -taking advil.  Taking mylanta for stomach.  Not had any meds today, feeling a little better.  Ate today and not pain today. Bm- no blood or black stools.  Had bm yesterday and today. Colonoscopy- around 60 yrs old.  Normally has BM 2x per day.  When feeling constipated going 2 days w/o a bm. Only small amt came out. No softerns or laxatives.   Used to have reflux.  Had hiatal hernia- and then it didn't bother him. Not having refluxing for a while.   Medical History Victor Gonzales has a past medical history of Diabetes mellitus without complication ([place]), GERD (gastroesophageal reflux disease), Hyperlipidemia, and Hypertension.   Outpatient Encounter Medications as of 12/16/2020  Medication Sig   amLODipine (NORVASC) 10 MG tablet Take 1 tablet (10 mg total) by mouth daily.   aspirin 81 MG tablet Take 81 mg by mouth. Takes one every other day   bisoprolol-hydrochlorothiazide (ZIAC) 10-6.25 MG tablet Take 1 tablet by mouth daily.   esomeprazole (NEXIUM) 40 MG capsule Take 1 capsule (40 mg total) by mouth daily.   FARXIGA 10 MG TABS tablet TAKE 1 TABLET BY MOUTH DAILY   glimepiride (AMARYL) 4 MG tablet TAKE 2 TABLETS(8 MG) BY MOUTH DAILY   metFORMIN (GLUCOPHAGE) 500 MG tablet Take 2 tablets (1,000 mg total) by mouth 2 (two) times daily.   ramipril (ALTACE) 10 MG capsule TAKE 2 CAPSULES(20 MG) BY MOUTH DAILY   Multiple Vitamin (MULTIVITAMIN) tablet Take 1 tablet by mouth daily.   No facility-administered  encounter medications on file as of 12/16/2020.     Review of Systems  Constitutional:  Negative for chills and fever.  HENT:  Negative for congestion, rhinorrhea and sore throat.   Respiratory:  Negative for cough, shortness of breath and wheezing.   Cardiovascular:  Negative for chest pain and leg swelling.  Gastrointestinal:  Positive for abdominal pain. Negative for diarrhea, nausea and vomiting.  Genitourinary:  Negative for dysuria and frequency.  Musculoskeletal:  Positive for back pain.  Skin:  Negative for rash.  Neurological:  Negative for dizziness, weakness and headaches.    Vitals BP (!) 162/80   Pulse 70   Temp (!) 97 F (36.1 C)   Ht '5\' 7"'  (1.702 m)   Wt 153 lb (69.4 kg)   SpO2 98%   BMI 23.96 kg/m   Objective:   Physical Exam Vitals and nursing note reviewed.  Constitutional:      General: He is not in acute distress.    Appearance: Normal appearance. He is not ill-appearing.  HENT:     Head: Normocephalic.  Eyes:     Extraocular Movements: Extraocular movements intact.     Conjunctiva/sclera: Conjunctivae normal.     Pupils: Pupils are equal, round, and reactive to light.  Cardiovascular:     Rate and Rhythm: Normal rate and regular rhythm.     Pulses:  Normal pulses.     Heart sounds: Normal heart sounds. No murmur heard. Pulmonary:     Effort: Pulmonary effort is normal.     Breath sounds: Normal breath sounds. No wheezing, rhonchi or rales.  Abdominal:     General: Abdomen is flat. Bowel sounds are normal. There is no distension.     Palpations: Abdomen is soft. There is no mass.     Tenderness: There is abdominal tenderness (mid abd near umbilicus). There is no guarding or rebound.     Hernia: No hernia is present.  Musculoskeletal:        General: No tenderness. Normal range of motion.     Right lower leg: No edema.     Left lower leg: No edema.     Comments: Normal rom of back. No ttp over T or L spinous process. Pain over throacic  paraspinal areas bilaterally.   Skin:    General: Skin is warm and dry.     Findings: No rash.  Neurological:     General: No focal deficit present.     Mental Status: He is alert and oriented to person, place, and time.     Cranial Nerves: No cranial nerve deficit.  Psychiatric:        Mood and Affect: Mood normal.        Behavior: Behavior normal.        Thought Content: Thought content normal.        Judgment: Judgment normal.     Assessment and Plan   1. Epigastric pain - CMP14+EGFR - CBC With Differential - DG Abd 1 View; Future - esomeprazole (NEXIUM) 40 MG capsule; Take 1 capsule (40 mg total) by mouth daily.  Dispense: 30 capsule; Refill: 1 - Comprehensive metabolic panel - Lipase - CBC with Differential/Platelet  2. Mid back pain   Reviewed to stop taking nsaids and to take tylenol prn pain.  Ordered labs to evaluate his abd pain.  [place] nexium for stomach pain and take tums or mylanta prn. Avoid citrus, spicy, greasy, or tomato based foods. Avoid Alcohol and caffeine. Call or rto if not improving.  Return in about 4 weeks (around 01/13/2021), or if symptoms worsen or fail to improve, for f/u epigastric pain and back pain.

## 2020-12-30 ENCOUNTER — Other Ambulatory Visit: Payer: Self-pay | Admitting: Family Medicine

## 2021-01-03 ENCOUNTER — Other Ambulatory Visit: Payer: Self-pay | Admitting: Family Medicine

## 2021-01-17 ENCOUNTER — Other Ambulatory Visit: Payer: Self-pay

## 2021-01-17 ENCOUNTER — Ambulatory Visit: Payer: Federal, State, Local not specified - PPO | Admitting: Family Medicine

## 2021-01-17 VITALS — BP 138/78 | HR 74 | Temp 97.4°F | Ht 67.0 in | Wt 156.4 lb

## 2021-01-17 DIAGNOSIS — E1140 Type 2 diabetes mellitus with diabetic neuropathy, unspecified: Secondary | ICD-10-CM

## 2021-01-17 DIAGNOSIS — R1013 Epigastric pain: Secondary | ICD-10-CM

## 2021-01-17 DIAGNOSIS — K760 Fatty (change of) liver, not elsewhere classified: Secondary | ICD-10-CM

## 2021-01-17 DIAGNOSIS — R748 Abnormal levels of other serum enzymes: Secondary | ICD-10-CM

## 2021-01-17 DIAGNOSIS — I10 Essential (primary) hypertension: Secondary | ICD-10-CM

## 2021-01-17 MED ORDER — METFORMIN HCL 500 MG PO TABS: 1000.0000 mg | ORAL_TABLET | Freq: Two times a day (BID) | ORAL | 1 refills | Status: DC

## 2021-01-17 MED ORDER — DAPAGLIFLOZIN PROPANEDIOL 10 MG PO TABS: 10.0000 mg | ORAL_TABLET | Freq: Every day | ORAL | 1 refills | Status: DC

## 2021-01-17 MED ORDER — ESOMEPRAZOLE MAGNESIUM 40 MG PO CPDR: 40.0000 mg | DELAYED_RELEASE_CAPSULE | Freq: Every day | ORAL | 1 refills | Status: DC

## 2021-01-17 MED ORDER — BISOPROLOL-HYDROCHLOROTHIAZIDE 10-6.25 MG PO TABS: 1.0000 | ORAL_TABLET | Freq: Every day | ORAL | 1 refills | Status: DC

## 2021-01-17 MED ORDER — RAMIPRIL 10 MG PO CAPS: ORAL_CAPSULE | ORAL | 1 refills | Status: DC

## 2021-01-17 MED ORDER — GLIMEPIRIDE 4 MG PO TABS: ORAL_TABLET | ORAL | 1 refills | Status: DC

## 2021-01-17 MED ORDER — AMLODIPINE BESYLATE 10 MG PO TABS: ORAL_TABLET | ORAL | 1 refills | Status: DC

## 2021-01-17 NOTE — Progress Notes (Signed)
Patient ID: Victor Gonzales, male    DOB: 04-24-1961, 60 y.o.   MRN: [id number]   Chief Complaint  Patient presents with   epigastric pain follow up   Gastroesophageal Reflux   Hyperlipidemia   Hypertension   Diabetes   Subjective:    HPI [name]- Patient states his pain got better within a few days of starting the Nexium and he is doing much better on the medication  No reflux, no blood or black stools.  Seen by GI in past and has a fatty liver.  Had biopsy of liver in past in 2005.  U/s in 2010- fatty liver disease.  Had elevated rbc last few visits and in past had elevated iron and had to do phlebotomy. Then didn't keep it up. Pt stating was told didn't need to keep doing it.  Not seen GI in a while for liver.  HTN Pt compliant with BP meds.  No SEs Denies chest pain, sob, LE swelling, or blurry vision.  Taking all 3 bp meds in am. No h/o smoking. No alcohol intake.  Does eat salty items.  Dm2- Compliant with medications. Checking blood glucose.   Not seeing any high or low numbers.  Denies polyuria or polydipsia.  Eye exam: having eye exam next month Foot exam: no foot concerns/sores/burning pain.  Lab Results  Component Value Date   HGBA1C 7.5 (H) 08/01/2020   Occ seeing fasting -100-103 glucose.  HLD- doing well no new concerns. Not on medications for this. No chest pain, palpitations, myalgias or joint pains.  Medical History Michaelanthony has a past medical history of Diabetes mellitus without complication (HCC), GERD (gastroesophageal reflux disease), Hyperlipidemia, and Hypertension.   Outpatient Encounter Medications as of 01/17/2021  Medication Sig   amLODipine (NORVASC) 10 MG tablet TAKE 1 TABLET(10 MG) BY MOUTH DAILY   aspirin 81 MG tablet Take 81 mg by mouth. Takes one every other day   bisoprolol-hydrochlorothiazide (ZIAC) 10-6.25 MG tablet Take 1 tablet by mouth daily.   dapagliflozin propanediol (FARXIGA) 10 MG TABS tablet Take 1 tablet (10 mg total)  by mouth daily.   esomeprazole (NEXIUM) 40 MG capsule Take 1 capsule (40 mg total) by mouth daily.   glimepiride (AMARYL) 4 MG tablet TAKE 2 TABLETS(8 MG) BY MOUTH DAILY   metFORMIN (GLUCOPHAGE) 500 MG tablet Take 2 tablets (1,000 mg total) by mouth 2 (two) times daily.   Multiple Vitamin (MULTIVITAMIN) tablet Take 1 tablet by mouth daily.   ramipril (ALTACE) 10 MG capsule TAKE 2 CAPSULES(20 MG) BY MOUTH DAILY   [DISCONTINUED] amLODipine (NORVASC) 10 MG tablet TAKE 1 TABLET(10 MG) BY MOUTH DAILY   [DISCONTINUED] bisoprolol-hydrochlorothiazide (ZIAC) 10-6.25 MG tablet Take 1 tablet by mouth daily.   [DISCONTINUED] esomeprazole (NEXIUM) 40 MG capsule Take 1 capsule (40 mg total) by mouth daily.   [DISCONTINUED] FARXIGA 10 MG TABS tablet TAKE 1 TABLET BY MOUTH DAILY   [DISCONTINUED] glimepiride (AMARYL) 4 MG tablet TAKE 2 TABLETS(8 MG) BY MOUTH DAILY   [DISCONTINUED] metFORMIN (GLUCOPHAGE) 500 MG tablet Take 2 tablets (1,000 mg total) by mouth 2 (two) times daily.   [DISCONTINUED] ramipril (ALTACE) 10 MG capsule TAKE 2 CAPSULES(20 MG) BY MOUTH DAILY   No facility-administered encounter medications on file as of 01/17/2021.     Review of Systems  Constitutional:  Negative for chills and fever.  HENT:  Negative for congestion, rhinorrhea and sore throat.   Respiratory:  Negative for cough, shortness of breath and wheezing.   Cardiovascular:  Negative  for chest pain and leg swelling.  Gastrointestinal:  Negative for abdominal pain, diarrhea, nausea and vomiting.  Genitourinary:  Negative for dysuria and frequency.  Skin:  Negative for rash.  Neurological:  Negative for dizziness, weakness and headaches.    Vitals BP 138/78   Pulse 74   Temp (!) 97.4 F (36.3 C) (Oral)   Ht 5\' 7"  (1.702 m)   Wt 156 lb 6.4 oz (70.9 kg)   BMI 24.50 kg/m   Objective:   Physical Exam Vitals and nursing note reviewed.  Constitutional:      General: He is not in acute distress.    Appearance: Normal  appearance. He is not ill-appearing.  HENT:     Head: Normocephalic.     Nose: Nose normal. No congestion.     Mouth/Throat:     Mouth: Mucous membranes are moist.     Pharynx: No oropharyngeal exudate.  Eyes:     Extraocular Movements: Extraocular movements intact.     Conjunctiva/sclera: Conjunctivae normal.     Pupils: Pupils are equal, round, and reactive to light.  Cardiovascular:     Rate and Rhythm: Normal rate and regular rhythm.     Pulses: Normal pulses.     Heart sounds: Normal heart sounds. No murmur heard. Pulmonary:     Effort: Pulmonary effort is normal. No respiratory distress.     Breath sounds: Normal breath sounds. No wheezing, rhonchi or rales.  Musculoskeletal:        General: Normal range of motion.     Right lower leg: No edema.     Left lower leg: No edema.  Skin:    General: Skin is warm and dry.     Findings: No rash.  Neurological:     General: No focal deficit present.     Mental Status: He is alert and oriented to person, place, and time.     Cranial Nerves: No cranial nerve deficit.  Psychiatric:        Mood and Affect: Mood normal.        Behavior: Behavior normal.        Thought Content: Thought content normal.        Judgment: Judgment normal.     Assessment and Plan   1. Type 2 diabetes mellitus with diabetic neuropathy, without long-term current use of insulin (HCC)  2. Fatty liver  3. Epigastric pain - esomeprazole (NEXIUM) 40 MG capsule; Take 1 capsule (40 mg total) by mouth daily.  Dispense: 30 capsule; Refill: 1  4. Elevated liver enzymes  5. Primary hypertension   Labs to be done on next visit.  Htn- suboptimal. - cont to watch salt in diet.  Recheck on next ivsiit.  May need to add additional bp med.  Cont with ramapril, amlodipine, and bisprolol/hctz.  Dm2- stable. Cont meds. Last a1c 7.5. controlled.  Elevated lfts and h/o fatty liver- cont to monitor. Recheck on next visit. Has had biopsy 2005 and u/s abd in 2010  and seen by GI in the past.  Pt has easy bruising and prolonged bleeding with taking 81mg  baby asa.  Since not had h/o MI or CVA, advising pt can discontinue taking the asa.  Pt is going to discontinue the asa.  Return in about 6 months (around 07/20/2021) for f/u fatty liver, dm2, htn, gerd.

## 2021-02-10 ENCOUNTER — Telehealth: Payer: Self-pay | Admitting: Nurse Practitioner

## 2021-02-10 DIAGNOSIS — E113212 Type 2 diabetes mellitus with mild nonproliferative diabetic retinopathy with macular edema, left eye: Secondary | ICD-10-CM | POA: Diagnosis not present

## 2021-02-10 NOTE — Telephone Encounter (Signed)
Patient is needing a prescription for test stripes called into [facility]

## 2021-02-11 ENCOUNTER — Other Ambulatory Visit: Payer: Self-pay | Admitting: Nurse Practitioner

## 2021-02-11 MED ORDER — BLOOD GLUCOSE TEST VI STRP: ORAL_STRIP | 0 refills | Status: DC

## 2021-02-11 NOTE — Telephone Encounter (Signed)
Done

## 2021-02-24 ENCOUNTER — Other Ambulatory Visit: Payer: Self-pay | Admitting: Family Medicine

## 2021-02-25 ENCOUNTER — Telehealth: Payer: Self-pay | Admitting: Family Medicine

## 2021-02-25 ENCOUNTER — Telehealth: Payer: Self-pay | Admitting: Nurse Practitioner

## 2021-02-25 MED ORDER — LANCETS ULTRA THIN 30G MISC: 1 refills | Status: DC

## 2021-02-25 NOTE — Telephone Encounter (Signed)
Patient is requesting refill on lancet is completely out.

## 2021-02-25 NOTE — Telephone Encounter (Signed)
Prescription sent electronically to pharmacy. 

## 2021-03-03 NOTE — Telephone Encounter (Signed)
Nurse please close error °

## 2021-04-16 ENCOUNTER — Other Ambulatory Visit: Payer: Self-pay | Admitting: Family Medicine

## 2021-04-21 ENCOUNTER — Other Ambulatory Visit: Payer: Self-pay | Admitting: Nurse Practitioner

## 2021-04-21 DIAGNOSIS — E1140 Type 2 diabetes mellitus with diabetic neuropathy, unspecified: Secondary | ICD-10-CM

## 2021-04-28 ENCOUNTER — Other Ambulatory Visit: Payer: Self-pay | Admitting: Family Medicine

## 2021-05-14 ENCOUNTER — Other Ambulatory Visit: Payer: Self-pay | Admitting: Family Medicine

## 2021-05-14 ENCOUNTER — Telehealth: Payer: Self-pay | Admitting: *Deleted

## 2021-05-14 MED ORDER — OSELTAMIVIR PHOSPHATE 75 MG PO CAPS: 75.0000 mg | ORAL_CAPSULE | Freq: Two times a day (BID) | ORAL | 0 refills | Status: AC

## 2021-05-14 NOTE — Telephone Encounter (Signed)
Patient requesting script for tamiflu- see mychart message

## 2021-05-28 ENCOUNTER — Telehealth: Payer: Self-pay | Admitting: Family Medicine

## 2021-05-28 DIAGNOSIS — I10 Essential (primary) hypertension: Secondary | ICD-10-CM

## 2021-05-28 DIAGNOSIS — E785 Hyperlipidemia, unspecified: Secondary | ICD-10-CM

## 2021-05-28 DIAGNOSIS — E119 Type 2 diabetes mellitus without complications: Secondary | ICD-10-CM

## 2021-05-28 DIAGNOSIS — K760 Fatty (change of) liver, not elsewhere classified: Secondary | ICD-10-CM

## 2021-05-28 NOTE — Telephone Encounter (Signed)
Last labs 08/01/20: CBC, CMP, Lipid, HgbA1c, Urine Microalbumin

## 2021-05-28 NOTE — Telephone Encounter (Signed)
Patient has 6 month follow up on 2/27 and needing labs done.

## 2021-05-29 DIAGNOSIS — E083293 Diabetes mellitus due to underlying condition with mild nonproliferative diabetic retinopathy without macular edema, bilateral: Secondary | ICD-10-CM | POA: Diagnosis not present

## 2021-05-29 NOTE — Telephone Encounter (Signed)
Tommie Sams, DO   Okay to order labs; No need for microalbumin this time.   Please order.   Thank you   Dr. Adriana Simas

## 2021-05-29 NOTE — Telephone Encounter (Signed)
Blood work ordered in Epic. Patient notified. 

## 2021-07-03 ENCOUNTER — Other Ambulatory Visit: Payer: Self-pay | Admitting: *Deleted

## 2021-07-03 DIAGNOSIS — E1140 Type 2 diabetes mellitus with diabetic neuropathy, unspecified: Secondary | ICD-10-CM

## 2021-07-03 MED ORDER — ACCU-CHEK FASTCLIX LANCETS MISC: 1 refills | Status: DC

## 2021-07-14 DIAGNOSIS — K760 Fatty (change of) liver, not elsewhere classified: Secondary | ICD-10-CM | POA: Diagnosis not present

## 2021-07-14 DIAGNOSIS — E785 Hyperlipidemia, unspecified: Secondary | ICD-10-CM | POA: Diagnosis not present

## 2021-07-14 DIAGNOSIS — I10 Essential (primary) hypertension: Secondary | ICD-10-CM | POA: Diagnosis not present

## 2021-07-14 DIAGNOSIS — E119 Type 2 diabetes mellitus without complications: Secondary | ICD-10-CM | POA: Diagnosis not present

## 2021-07-15 LAB — CBC WITH DIFFERENTIAL/PLATELET
Basophils Absolute: 0.1 10*3/uL (ref 0.0–0.2)
Basos: 1 %
EOS (ABSOLUTE): 0.5 10*3/uL — ABNORMAL HIGH (ref 0.0–0.4)
Eos: 7 %
Hematocrit: 53.5 % — ABNORMAL HIGH (ref 37.5–51.0)
Hemoglobin: 18 g/dL — ABNORMAL HIGH (ref 13.0–17.7)
Immature Grans (Abs): 0 10*3/uL (ref 0.0–0.1)
Immature Granulocytes: 0 %
Lymphocytes Absolute: 2.5 10*3/uL (ref 0.7–3.1)
Lymphs: 36 %
MCH: 28.5 pg (ref 26.6–33.0)
MCHC: 33.6 g/dL (ref 31.5–35.7)
MCV: 85 fL (ref 79–97)
Monocytes Absolute: 0.6 10*3/uL (ref 0.1–0.9)
Monocytes: 9 %
Neutrophils Absolute: 3.2 10*3/uL (ref 1.4–7.0)
Neutrophils: 47 %
Platelets: 180 10*3/uL (ref 150–450)
RBC: 6.32 x10E6/uL — ABNORMAL HIGH (ref 4.14–5.80)
RDW: 12.6 % (ref 11.6–15.4)
WBC: 6.9 10*3/uL (ref 3.4–10.8)

## 2021-07-15 LAB — LIPID PANEL
Chol/HDL Ratio: 3.8 ratio (ref 0.0–5.0)
Cholesterol, Total: 200 mg/dL — ABNORMAL HIGH (ref 100–199)
HDL: 52 mg/dL (ref >39)
LDL Chol Calc (NIH): 126 mg/dL — ABNORMAL HIGH (ref 0–99)
Triglycerides: 122 mg/dL (ref 0–149)
VLDL Cholesterol Cal: 22 mg/dL (ref 5–40)

## 2021-07-15 LAB — COMPREHENSIVE METABOLIC PANEL
ALT: 82 IU/L — ABNORMAL HIGH (ref 0–44)
AST: 48 IU/L — ABNORMAL HIGH (ref 0–40)
Albumin/Globulin Ratio: 1.9 (ref 1.2–2.2)
Albumin: 4.9 g/dL — ABNORMAL HIGH (ref 3.8–4.8)
Alkaline Phosphatase: 72 IU/L (ref 44–121)
BUN/Creatinine Ratio: 13 (ref 10–24)
BUN: 11 mg/dL (ref 8–27)
Bilirubin Total: 0.9 mg/dL (ref 0.0–1.2)
CO2: 21 mmol/L (ref 20–29)
Calcium: 9.6 mg/dL (ref 8.6–10.2)
Chloride: 105 mmol/L (ref 96–106)
Creatinine, Ser: 0.84 mg/dL (ref 0.76–1.27)
Globulin, Total: 2.6 g/dL (ref 1.5–4.5)
Glucose: 222 mg/dL — ABNORMAL HIGH (ref 70–99)
Potassium: 4.3 mmol/L (ref 3.5–5.2)
Sodium: 140 mmol/L (ref 134–144)
Total Protein: 7.5 g/dL (ref 6.0–8.5)
eGFR: 99 mL/min/{1.73_m2} (ref >59)

## 2021-07-15 LAB — HEMOGLOBIN A1C
Est. average glucose Bld gHb Est-mCnc: 212 mg/dL
Hgb A1c MFr Bld: 9 % — ABNORMAL HIGH (ref 4.8–5.6)

## 2021-07-20 ENCOUNTER — Other Ambulatory Visit: Payer: Self-pay | Admitting: Nurse Practitioner

## 2021-07-20 DIAGNOSIS — E1140 Type 2 diabetes mellitus with diabetic neuropathy, unspecified: Secondary | ICD-10-CM

## 2021-07-21 ENCOUNTER — Ambulatory Visit: Payer: Federal, State, Local not specified - PPO | Admitting: Family Medicine

## 2021-07-21 ENCOUNTER — Encounter: Payer: Self-pay | Admitting: Family Medicine

## 2021-07-21 ENCOUNTER — Other Ambulatory Visit: Payer: Self-pay

## 2021-07-21 DIAGNOSIS — E785 Hyperlipidemia, unspecified: Secondary | ICD-10-CM | POA: Insufficient documentation

## 2021-07-21 DIAGNOSIS — E119 Type 2 diabetes mellitus without complications: Secondary | ICD-10-CM | POA: Diagnosis not present

## 2021-07-21 DIAGNOSIS — I10 Essential (primary) hypertension: Secondary | ICD-10-CM

## 2021-07-21 MED ORDER — ROSUVASTATIN CALCIUM 10 MG PO TABS: 10.0000 mg | ORAL_TABLET | Freq: Every day | ORAL | 3 refills | Status: DC

## 2021-07-21 NOTE — Patient Instructions (Signed)
Watch your diet with focus on whole foods and decreasing carbs.  Start Crestor daily.  Follow up in 3 months.  Take care  Dr. Lacinda Axon

## 2021-07-22 NOTE — Assessment & Plan Note (Signed)
Uncontrolled.  After discussion with patient, we elected to proceed with continued use of his current medications and lifestyle/dietary changes.  Follow-up A1c in 3 months.

## 2021-07-22 NOTE — Assessment & Plan Note (Signed)
Blood pressure was improved on recheck.  Advised continued use of his current medications.  Dietary changes.  He will check his blood pressures at home as well.  Follow-up in 3 months

## 2021-07-22 NOTE — Assessment & Plan Note (Signed)
Starting on moderate intensity statin with Crestor 10 mg daily.

## 2021-07-22 NOTE — Progress Notes (Signed)
Subjective:  Patient ID: Victor Gonzales, male    DOB: [birth year]  Age: 61 y.o. MRN: [id number]  CC: Chief Complaint  Patient presents with   Follow-up    Diabetes.  Blood sugars have been running good when he checks them.     HPI:  61 year old male with hypertension, hepatic steatosis, type 2 diabetes, hyperlipidemia presents for follow-up.  Type 2 diabetes Uncontrolled.  A1c has risen from 7.5 to 9. Patient is currently on metformin 1000 mg twice daily, glimepiride 8 mg daily, and Farxiga 10 mg daily. He endorses compliance with therapy.  Hypertension BP elevated today.  He endorses compliance with bisoprolol/HCTZ, ramipril, and amlodipine.  Hyperlipidemia Patient is not currently on any therapy regarding his lipids.  Patient is at high risk due to underlying diabetes.  Most recent LDL 126.  Patient Active Problem List   Diagnosis Date Noted   Hyperlipidemia 07/21/2021   Type 2 diabetes mellitus without complications (HCC) 07/21/2021   Fatty liver 03/25/2020   Insomnia 04/26/2013   Generalized anxiety disorder 01/26/2013   HTN (hypertension) 10/26/2012    Social Hx   Social History   Socioeconomic History   Marital status: Married    Spouse name: Not on file   Number of children: Not on file   Years of education: Not on file   Highest education level: Not on file  Occupational History   Not on file  Tobacco Use   Smoking status: Never   Smokeless tobacco: Never  Substance and Sexual Activity   Alcohol use: No   Drug use: No   Sexual activity: Not on file  Other Topics Concern   Not on file  Social History Narrative   Not on file   Social Determinants of Health   Financial Resource Strain: Not on file  Food Insecurity: Not on file  Transportation Needs: Not on file  Physical Activity: Not on file  Stress: Not on file  Social Connections: Not on file    Review of Systems  Constitutional: Negative.   Respiratory: Negative.    Cardiovascular:  Negative.    Objective:  BP (!) 150/74    Pulse 75    Temp 98.5 F (36.9 C) (Oral)    Ht 5\' 7"  (1.702 m)    Wt 153 lb 9.6 oz (69.7 kg)    SpO2 97%    BMI 24.06 kg/m   BP/Weight 07/21/2021 01/17/2021 12/16/2020  Systolic BP 150 138 162  Diastolic BP 74 78 80  Wt. (Lbs) 153.6 156.4 153  BMI 24.06 24.5 23.96    Physical Exam Vitals and nursing note reviewed.  Constitutional:      General: He is not in acute distress.    Appearance: Normal appearance. He is not ill-appearing.  Eyes:     General:        Right eye: No discharge.        Left eye: No discharge.     Conjunctiva/sclera: Conjunctivae normal.  Cardiovascular:     Rate and Rhythm: Normal rate and regular rhythm.  Pulmonary:     Effort: Pulmonary effort is normal.     Breath sounds: Normal breath sounds. No wheezing, rhonchi or rales.  Neurological:     Mental Status: He is alert.  Psychiatric:        Mood and Affect: Mood normal.        Behavior: Behavior normal.  Diabetic Foot Check -  Appearance - no lesions, ulcers or calluses Skin - no unusual  pallor or redness Monofilament testing -  Right - Great toe, medial, central, lateral ball and posterior foot intact Left - Great toe, medial, central, lateral ball and posterior foot intact   Lab Results  Component Value Date   WBC 6.9 07/14/2021   HGB 18.0 (H) 07/14/2021   HCT 53.5 (H) 07/14/2021   PLT 180 07/14/2021   GLUCOSE 222 (H) 07/14/2021   CHOL 200 (H) 07/14/2021   TRIG 122 07/14/2021   HDL 52 07/14/2021   LDLCALC 126 (H) 07/14/2021   ALT 82 (H) 07/14/2021   AST 48 (H) 07/14/2021   NA 140 07/14/2021   K 4.3 07/14/2021   CL 105 07/14/2021   CREATININE 0.84 07/14/2021   BUN 11 07/14/2021   CO2 21 07/14/2021   PSA 0.31 05/31/2014   HGBA1C 9.0 (H) 07/14/2021   MICROALBUR 2.60 (H) 04/29/2013     Assessment & Plan:   Problem List Items Addressed This Visit       Cardiovascular and Mediastinum   HTN (hypertension)    Blood pressure was improved  on recheck.  Advised continued use of his current medications.  Dietary changes.  He will check his blood pressures at home as well.  Follow-up in 3 months      Relevant Medications   rosuvastatin (CRESTOR) 10 MG tablet     Endocrine   Type 2 diabetes mellitus without complications (HCC)    Uncontrolled.  After discussion with patient, we elected to proceed with continued use of his current medications and lifestyle/dietary changes.  Follow-up A1c in 3 months.      Relevant Medications   rosuvastatin (CRESTOR) 10 MG tablet     Other   Hyperlipidemia    Starting on moderate intensity statin with Crestor 10 mg daily.      Relevant Medications   rosuvastatin (CRESTOR) 10 MG tablet    Meds ordered this encounter  Medications   rosuvastatin (CRESTOR) 10 MG tablet    Sig: Take 1 tablet (10 mg total) by mouth daily.    Dispense:  90 tablet    Refill:  3    Follow-up:  Return in about 3 months (around 10/18/2021).  Everlene Other DO [facility]

## 2021-08-01 ENCOUNTER — Other Ambulatory Visit: Payer: Self-pay | Admitting: Nurse Practitioner

## 2021-08-29 ENCOUNTER — Other Ambulatory Visit: Payer: Self-pay | Admitting: Family Medicine

## 2021-09-01 ENCOUNTER — Other Ambulatory Visit: Payer: Self-pay

## 2021-09-01 MED ORDER — GLIMEPIRIDE 4 MG PO TABS: ORAL_TABLET | ORAL | 0 refills | Status: DC

## 2021-09-08 ENCOUNTER — Other Ambulatory Visit: Payer: Self-pay | Admitting: Family Medicine

## 2021-09-08 ENCOUNTER — Other Ambulatory Visit: Payer: Self-pay

## 2021-09-08 MED ORDER — RAMIPRIL 10 MG PO CAPS: ORAL_CAPSULE | ORAL | 0 refills | Status: DC

## 2021-09-30 ENCOUNTER — Other Ambulatory Visit: Payer: Self-pay | Admitting: Family Medicine

## 2021-10-01 ENCOUNTER — Other Ambulatory Visit: Payer: Self-pay

## 2021-10-01 MED ORDER — METFORMIN HCL 500 MG PO TABS: 1000.0000 mg | ORAL_TABLET | Freq: Two times a day (BID) | ORAL | 0 refills | Status: DC

## 2021-10-04 ENCOUNTER — Other Ambulatory Visit: Payer: Self-pay | Admitting: Family Medicine

## 2021-10-05 ENCOUNTER — Observation Stay (HOSPITAL_COMMUNITY): Payer: Federal, State, Local not specified - PPO

## 2021-10-05 ENCOUNTER — Emergency Department (HOSPITAL_COMMUNITY): Payer: Federal, State, Local not specified - PPO

## 2021-10-05 ENCOUNTER — Encounter: Payer: Self-pay | Admitting: Family Medicine

## 2021-10-05 ENCOUNTER — Encounter (HOSPITAL_COMMUNITY): Payer: Self-pay | Admitting: *Deleted

## 2021-10-05 ENCOUNTER — Observation Stay (HOSPITAL_COMMUNITY)
Admission: EM | Admit: 2021-10-05 | Discharge: 2021-10-06 | Disposition: A | Discharge status: Home / Self Care | Payer: Federal, State, Local not specified - PPO | Attending: Internal Medicine | Admitting: Internal Medicine

## 2021-10-05 ENCOUNTER — Other Ambulatory Visit: Payer: Self-pay

## 2021-10-05 DIAGNOSIS — I639 Cerebral infarction, unspecified: Principal | ICD-10-CM | POA: Diagnosis present

## 2021-10-05 DIAGNOSIS — R531 Weakness: Secondary | ICD-10-CM | POA: Diagnosis not present

## 2021-10-05 DIAGNOSIS — Z20822 Contact with and (suspected) exposure to covid-19: Secondary | ICD-10-CM | POA: Diagnosis not present

## 2021-10-05 DIAGNOSIS — R29818 Other symptoms and signs involving the nervous system: Secondary | ICD-10-CM | POA: Diagnosis not present

## 2021-10-05 DIAGNOSIS — Z8673 Personal history of transient ischemic attack (TIA), and cerebral infarction without residual deficits: Secondary | ICD-10-CM | POA: Diagnosis not present

## 2021-10-05 DIAGNOSIS — Z7984 Long term (current) use of oral hypoglycemic drugs: Secondary | ICD-10-CM | POA: Diagnosis not present

## 2021-10-05 DIAGNOSIS — Z7982 Long term (current) use of aspirin: Secondary | ICD-10-CM | POA: Insufficient documentation

## 2021-10-05 DIAGNOSIS — E785 Hyperlipidemia, unspecified: Secondary | ICD-10-CM | POA: Diagnosis present

## 2021-10-05 DIAGNOSIS — Z79899 Other long term (current) drug therapy: Secondary | ICD-10-CM | POA: Insufficient documentation

## 2021-10-05 DIAGNOSIS — I6611 Occlusion and stenosis of right anterior cerebral artery: Secondary | ICD-10-CM | POA: Diagnosis not present

## 2021-10-05 DIAGNOSIS — R2990 Unspecified symptoms and signs involving the nervous system: Secondary | ICD-10-CM | POA: Diagnosis not present

## 2021-10-05 DIAGNOSIS — I10 Essential (primary) hypertension: Secondary | ICD-10-CM | POA: Diagnosis not present

## 2021-10-05 DIAGNOSIS — E119 Type 2 diabetes mellitus without complications: Secondary | ICD-10-CM

## 2021-10-05 DIAGNOSIS — I6601 Occlusion and stenosis of right middle cerebral artery: Secondary | ICD-10-CM | POA: Diagnosis not present

## 2021-10-05 DIAGNOSIS — Z7902 Long term (current) use of antithrombotics/antiplatelets: Secondary | ICD-10-CM | POA: Diagnosis not present

## 2021-10-05 DIAGNOSIS — F411 Generalized anxiety disorder: Secondary | ICD-10-CM | POA: Diagnosis present

## 2021-10-05 DIAGNOSIS — I6523 Occlusion and stenosis of bilateral carotid arteries: Secondary | ICD-10-CM | POA: Diagnosis not present

## 2021-10-05 LAB — APTT: aPTT: 28 seconds (ref 24–36)

## 2021-10-05 LAB — DIFFERENTIAL
Abs Immature Granulocytes: 0.03 10*3/uL (ref 0.00–0.07)
Basophils Absolute: 0.1 10*3/uL (ref 0.0–0.1)
Basophils Relative: 1 %
Eosinophils Absolute: 0.6 10*3/uL — ABNORMAL HIGH (ref 0.0–0.5)
Eosinophils Relative: 8 %
Immature Granulocytes: 0 %
Lymphocytes Relative: 37 %
Lymphs Abs: 2.7 10*3/uL (ref 0.7–4.0)
Monocytes Absolute: 0.7 10*3/uL (ref 0.1–1.0)
Monocytes Relative: 9 %
Neutro Abs: 3.3 10*3/uL (ref 1.7–7.7)
Neutrophils Relative %: 45 %

## 2021-10-05 LAB — COMPREHENSIVE METABOLIC PANEL
ALT: 39 U/L (ref 0–44)
AST: 39 U/L (ref 15–41)
Albumin: 4.5 g/dL (ref 3.5–5.0)
Alkaline Phosphatase: 52 U/L (ref 38–126)
Anion gap: 7 (ref 5–15)
BUN: 15 mg/dL (ref 8–23)
CO2: 24 mmol/L (ref 22–32)
Calcium: 9.1 mg/dL (ref 8.9–10.3)
Chloride: 107 mmol/L (ref 98–111)
Creatinine, Ser: 0.72 mg/dL (ref 0.61–1.24)
GFR, Estimated: >60 mL/min (ref >60)
Glucose, Bld: 86 mg/dL (ref 70–99)
Potassium: 3.5 mmol/L (ref 3.5–5.1)
Sodium: 138 mmol/L (ref 135–145)
Total Bilirubin: 1.1 mg/dL (ref 0.3–1.2)
Total Protein: 7.6 g/dL (ref 6.5–8.1)

## 2021-10-05 LAB — URINALYSIS, ROUTINE W REFLEX MICROSCOPIC
Bacteria, UA: NONE SEEN
Bilirubin Urine: NEGATIVE
Glucose, UA: >=500 mg/dL — AB
Hgb urine dipstick: NEGATIVE
Ketones, ur: NEGATIVE mg/dL
Leukocytes,Ua: NEGATIVE
Nitrite: NEGATIVE
Protein, ur: NEGATIVE mg/dL
Specific Gravity, Urine: 1.024 (ref 1.005–1.030)
pH: 5 (ref 5.0–8.0)

## 2021-10-05 LAB — PROTIME-INR
INR: 1 (ref 0.8–1.2)
Prothrombin Time: 13.2 seconds (ref 11.4–15.2)

## 2021-10-05 LAB — RAPID URINE DRUG SCREEN, HOSP PERFORMED
Amphetamines: NOT DETECTED
Barbiturates: NOT DETECTED
Benzodiazepines: NOT DETECTED
Cocaine: NOT DETECTED
Opiates: NOT DETECTED
Tetrahydrocannabinol: NOT DETECTED

## 2021-10-05 LAB — CBC
HCT: 50.5 % (ref 39.0–52.0)
Hemoglobin: 16.8 g/dL (ref 13.0–17.0)
MCH: 29 pg (ref 26.0–34.0)
MCHC: 33.3 g/dL (ref 30.0–36.0)
MCV: 87.1 fL (ref 80.0–100.0)
Platelets: 161 10*3/uL (ref 150–400)
RBC: 5.8 MIL/uL (ref 4.22–5.81)
RDW: 13.1 % (ref 11.5–15.5)
WBC: 7.3 10*3/uL (ref 4.0–10.5)
nRBC: 0 % (ref 0.0–0.2)

## 2021-10-05 LAB — ETHANOL: Alcohol, Ethyl (B): <10 mg/dL (ref <10)

## 2021-10-05 LAB — RESP PANEL BY RT-PCR (FLU A&B, COVID) ARPGX2
Influenza A by PCR: NEGATIVE
Influenza B by PCR: NEGATIVE
SARS Coronavirus 2 by RT PCR: NEGATIVE

## 2021-10-05 LAB — GLUCOSE, CAPILLARY
Glucose-Capillary: 77 mg/dL (ref 70–99)
Glucose-Capillary: 146 mg/dL — ABNORMAL HIGH (ref 70–99)

## 2021-10-05 LAB — CBG MONITORING, ED: Glucose-Capillary: 120 mg/dL — ABNORMAL HIGH (ref 70–99)

## 2021-10-05 MED ORDER — ROSUVASTATIN CALCIUM 10 MG PO TABS
10.0000 mg | ORAL_TABLET | Freq: Every day | ORAL | Status: DC
  Administered 2021-10-05 – 2021-10-06 (×2): 10 mg via ORAL
  Filled 2021-10-05 (×2): qty 1

## 2021-10-05 MED ORDER — SENNOSIDES-DOCUSATE SODIUM 8.6-50 MG PO TABS: 1.0000 | ORAL_TABLET | Freq: Every evening | ORAL | Status: DC | PRN

## 2021-10-05 MED ORDER — ACETAMINOPHEN 650 MG RE SUPP: 650.0000 mg | RECTAL | Status: DC | PRN

## 2021-10-05 MED ORDER — ASPIRIN EC 81 MG PO TBEC
81.0000 mg | DELAYED_RELEASE_TABLET | Freq: Every day | ORAL | Status: DC
  Administered 2021-10-06: 81 mg via ORAL
  Filled 2021-10-05: qty 1

## 2021-10-05 MED ORDER — INSULIN ASPART 100 UNIT/ML IJ SOLN: 0.0000 [IU] | Freq: Every day | INTRAMUSCULAR | Status: DC

## 2021-10-05 MED ORDER — ACETAMINOPHEN 325 MG PO TABS: 650.0000 mg | ORAL_TABLET | ORAL | Status: DC | PRN

## 2021-10-05 MED ORDER — CLOPIDOGREL BISULFATE 75 MG PO TABS
300.0000 mg | ORAL_TABLET | Freq: Once | ORAL | Status: AC
  Administered 2021-10-05: 300 mg via ORAL
  Filled 2021-10-05: qty 4

## 2021-10-05 MED ORDER — INSULIN ASPART 100 UNIT/ML IJ SOLN
0.0000 [IU] | Freq: Three times a day (TID) | INTRAMUSCULAR | Status: DC
  Administered 2021-10-06: 5 [IU] via SUBCUTANEOUS

## 2021-10-05 MED ORDER — STROKE: EARLY STAGES OF RECOVERY BOOK
Freq: Once | Status: AC
  Filled 2021-10-05: qty 1

## 2021-10-05 MED ORDER — STROKE: EARLY STAGES OF RECOVERY BOOK: Freq: Once | Status: DC

## 2021-10-05 MED ORDER — CLOPIDOGREL BISULFATE 75 MG PO TABS
75.0000 mg | ORAL_TABLET | Freq: Every day | ORAL | Status: DC
  Administered 2021-10-06: 75 mg via ORAL
  Filled 2021-10-05: qty 1

## 2021-10-05 MED ORDER — ACETAMINOPHEN 160 MG/5ML PO SOLN: 650.0000 mg | ORAL | Status: DC | PRN

## 2021-10-05 MED ORDER — ENOXAPARIN SODIUM 40 MG/0.4ML IJ SOSY
40.0000 mg | PREFILLED_SYRINGE | INTRAMUSCULAR | Status: DC
  Administered 2021-10-05: 40 mg via SUBCUTANEOUS
  Filled 2021-10-05: qty 0.4

## 2021-10-05 MED ORDER — IOHEXOL 350 MG/ML SOLN
75.0000 mL | Freq: Once | INTRAVENOUS | Status: AC | PRN
  Administered 2021-10-05: 75 mL via INTRAVENOUS

## 2021-10-05 MED ORDER — ASPIRIN 325 MG PO TABS
325.0000 mg | ORAL_TABLET | Freq: Once | ORAL | Status: AC
  Administered 2021-10-05: 325 mg via ORAL
  Filled 2021-10-05: qty 1

## 2021-10-05 NOTE — Consult Note (Signed)
Dr Iver Nestle on screen to evaluate pt ?

## 2021-10-05 NOTE — Progress Notes (Signed)
Pt arrived to room #309 via WC from ED. Ambulatory to bed without assistance. A&O x4. VSS.  Denies c/o at present. Oriented to room and safety procedures, states understanding. Telemetry on, NSR. Saline lock flushed without difficulty. Pt given soda and peanut butter crackers due to c/o hungry.  ?

## 2021-10-05 NOTE — Assessment & Plan Note (Signed)
-   Resume Crestor 

## 2021-10-05 NOTE — ED Notes (Signed)
Code Stroke paged

## 2021-10-05 NOTE — ED Provider Notes (Signed)
?[facility] DE[facility] ?Provider Note ? ? ?CSN: [id number] ?Arrival date & time: 10/05/21  1301 ? ?  ? ?History ? ?Chief Complaint  ?Patient presents with  ? Weakness  ? ? ?Victor Gonzales is a 61 y.o. male with a history of hypertension, anxiety, fatty liver, hyperlipidemia and type 2 diabetes presenting for evaluation of intermittent and various symptoms suggesting of possible TIAs versus CVA.  He describes having at least 1 episode last week of difficulty speaking which he states lasted the majority of the day but then completely resolved.  He was at church this morning when he started to feel shaky and had decreased strength in his left arm which his wife noted.  He was trying to write a check (patient is right-handed) and she noted that he was unable to fill out the check at that point she became concerned that he had had a stroke.  She is not able to say and neither can he when the last time he was able to write coherently might have been.  He did have a headache this morning and his blood pressure was elevated but this symptom has resolved.  He has had no other focal weakness, no headache, blurred vision confusion.  He has had no treatment prior to arrival.  He used to be on a baby aspirin but he stopped taking this because he was developing frequent bruising. ? ?The history is provided by the patient and the spouse.  ? ?  ? ?Home Medications ?Prior to Admission medications   ?Medication Sig Start Date End Date Taking? Authorizing Provider  ?Accu-Chek FastClix Lancets MISC Use to check blood sugar once a day ?Dx: E11.9 07/03/21   Tommie Sams, DO  ?amLODipine (NORVASC) 10 MG tablet TAKE 1 TABLET(10 MG) BY MOUTH DAILY ?Patient not taking: Reported on 07/21/2021 01/17/21   Laroy Apple M, DO  ?aspirin 81 MG tablet Take 81 mg by mouth. Takes one every other day    [provider]  ?bisoprolol-hydrochlorothiazide [facility]) 10-6.25 MG tablet TAKE 1 TABLET BY MOUTH DAILY 08/01/21   Everlene Other G, DO   ?dapagliflozin propanediol (FARXIGA) 10 MG TABS tablet TAKE 1 TABLET BY MOUTH DAILY 04/30/21   Everlene Other G, DO  ?glimepiride (AMARYL) 4 MG tablet TAKE 2 TABLETS(8 MG) BY MOUTH DAILY 09/01/21   Everlene Other G, DO  ?glucose blood (ACCU-CHEK GUIDE) test strip USE TO CHECK BLOOD DAILY 07/21/21   Tommie Sams, DO  ?Lancets Ultra Thin 30G MISC Use once a day dx: E11.9 02/25/21   Campbell Riches, NP  ?metFORMIN (GLUCOPHAGE) 500 MG tablet Take 2 tablets (1,000 mg total) by mouth 2 (two) times daily. 10/01/21   Tommie Sams, DO  ?Multiple Vitamin (MULTIVITAMIN) tablet Take 1 tablet by mouth daily.    [provider]  ?ramipril (ALTACE) 10 MG capsule TAKE 2 CAPSULES(20 MG) BY MOUTH DAILY 09/08/21   Tommie Sams, DO  ?rosuvastatin (CRESTOR) 10 MG tablet Take 1 tablet (10 mg total) by mouth daily. 07/21/21   Tommie Sams, DO  ?   ? ?Allergies    ?Codeine   ? ?Review of Systems   ?Review of Systems  ?Constitutional:  Negative for chills and fever.  ?HENT:  Negative for congestion and sore throat.   ?Eyes: Negative.   ?Respiratory:  Negative for chest tightness and shortness of breath.   ?Cardiovascular:  Negative for chest pain.  ?Gastrointestinal:  Negative for abdominal pain and nausea.  ?Genitourinary: Negative.   ?  Musculoskeletal:  Negative for arthralgias, joint swelling and neck pain.  ?Skin: Negative.  Negative for rash and wound.  ?Neurological:  Positive for speech difficulty and weakness. Negative for dizziness, light-headedness, numbness and headaches.  ?Psychiatric/Behavioral: Negative.    ?All other systems reviewed and are negative. ? ?Physical Exam ?Updated Vital Signs ?BP (!) 159/89   Pulse 65   Temp 97.8 ?F (36.6 ?C) (Oral)   Resp 17   Ht 5\' 7"  (1.702 m)   Wt 68.9 kg   SpO2 95%   BMI 23.81 kg/m?  ?Physical Exam ?Vitals and nursing note reviewed.  ?Constitutional:   ?   Appearance: He is well-developed.  ?HENT:  ?   Head: Normocephalic and atraumatic.  ?Eyes:  ?   Conjunctiva/sclera:  Conjunctivae normal.  ?Cardiovascular:  ?   Rate and Rhythm: Normal rate and regular rhythm.  ?   Heart sounds: Normal heart sounds.  ?Pulmonary:  ?   Effort: Pulmonary effort is normal.  ?   Breath sounds: Normal breath sounds. No wheezing.  ?Abdominal:  ?   General: Bowel sounds are normal.  ?   Palpations: Abdomen is soft.  ?   Tenderness: There is no abdominal tenderness.  ?Musculoskeletal:     ?   General: Normal range of motion.  ?   Cervical back: Normal range of motion.  ?Skin: ?   General: Skin is warm and dry.  ?Neurological:  ?   General: No focal deficit present.  ?   Mental Status: He is alert and oriented to person, place, and time.  ?   Cranial Nerves: No cranial nerve deficit.  ?   Sensory: No sensory deficit.  ?   Coordination: Coordination normal.  ?   Gait: Gait normal.  ?Psychiatric:     ?   Mood and Affect: Mood normal.  ? ? ?ED Results / Procedures / Treatments   ?Labs ?(all labs ordered are listed, but only abnormal results are displayed) ?Labs Reviewed  ?DIFFERENTIAL - Abnormal; Notable for the following components:  ?    Result Value  ? Eosinophils Absolute 0.6 (*)   ? All other components within normal limits  ?CBG MONITORING, ED - Abnormal; Notable for the following components:  ? Glucose-Capillary 120 (*)   ? All other components within normal limits  ?RESP PANEL BY RT-PCR (FLU A&B, COVID) [id number]  ?ETHANOL  ?PROTIME-INR  ?APTT  ?CBC  ?COMPREHENSIVE METABOLIC PANEL  ?RAPID URINE DRUG SCREEN, HOSP PERFORMED  ?URINALYSIS, ROUTINE W REFLEX MICROSCOPIC  ?I-STAT CHEM 8, ED  ? ? ?EKG ?EKG Interpretation ? ?Date/Time:  Sunday Oct 05 2021 13:15:47 EDT ?Ventricular Rate:  63 ?PR Interval:  136 ?QRS Duration: 94 ?QT Interval:  402 ?QTC Calculation: 411 ?R Axis:   77 ?Text Interpretation: Normal sinus rhythm Normal ECG When compared with ECG of 13-Jan-2010 08:59, No significant change was found Confirmed by 15-Jan-2010 [id number]) on 10/05/2021 1:28:59 PM ? ?Radiology ?CT HEAD CODE STROKE WO  CONTRAST ? ?Result Date: 10/05/2021 ?CLINICAL DATA:  Code stroke. Neuro deficit, acute, stroke suspected. Left-sided weakness. Difficulty writing today (right handed). EXAM: CT HEAD WITHOUT CONTRAST TECHNIQUE: Contiguous axial images were obtained from the base of the skull through the vertex without intravenous contrast. RADIATION DOSE REDUCTION: This exam was performed according to the departmental dose-optimization program which includes automated exposure control, adjustment of the mA and/or kV according to patient size and/or use of iterative reconstruction technique. COMPARISON:  None Available. FINDINGS: Brain: An infarct involving the left basal  ganglia and corona radiata is of indeterminate age but may be acute to subacute. No acute infarct is identified in the right cerebral hemisphere or cerebellum. No intracranial hemorrhage, mass, midline shift, or extra-axial fluid collection is evident. The ventricles and sulci are normal. Vascular: No hyperdense vessel. Skull: No fracture or suspicious osseous lesion. Sinuses/Orbits: Mild left maxillary and left ethmoid sinus mucosal thickening. Clear mastoid air cells. Unremarkable orbits. Other: None. ASPECTS [facility]([place] Stroke Program Early CT Score) Not scored due to the patient's symptoms over the past week not consistently corresponding to one hemisphere. IMPRESSION: 1. Recent appearing left basal ganglia infarct. 2. No intracranial hemorrhage. These results were called by telephone at the time of interpretation on 10/05/2021 at 2:17 pm to Dr. Vanetta MuldersScott Zackowski, who verbally acknowledged these results. Electronically Signed   By: Sebastian AcheAllen  Grady M.D.   On: 10/05/2021 14:20   ? ?Procedures ?Procedures  ? ? ?Medications Ordered in ED ?Medications  ? stroke: early stages of recovery book (has no administration in time range)  ? ? ?ED Course/ Medical Decision Making/ A&P ?  ?                        ?Medical Decision Making ?Patient with symptoms concerning for  TIA/CVA. ? ?Amount and/or Complexity of Data Reviewed ?Labs: ordered. ?Radiology: ordered. ?   Details: CT imaging is significant for a recent appearing left basal ganglia infarct.  There is no intracranial hemorrhage. ?Discussion of management

## 2021-10-05 NOTE — Assessment & Plan Note (Addendum)
Speech abnormality, right handwriting abnormality.  Symptoms  have resolved he is back to baseline.  CT suggests- Recent appearing Left basal ganglia infarct.  He was on daily aspirin up until a week ago when he started taking it every other day due to easy bruisability. ?-Teleneurology was consulted, will follow recommendations ?-MRI brain without contrast ?-CTA head and neck pending ?-Echocardiogram ?- HGbA1c, lipid panel ?-Aspirin 325 mg X 1 , continue with 81 mg, 200 mg load of Plavix and continue 75 mg daily for 21 to 90-day course ?- Resume crestor ?-Allow for permissive hypertension, as needed hydralazine 10 mg for blood pressure greater than 220/120 ? ?

## 2021-10-05 NOTE — ED Notes (Signed)
Pt passed stroke swallow screen  

## 2021-10-05 NOTE — ED Triage Notes (Signed)
Pt with elevated BP earlier today, went home took HTN med and feels better.  Pt states he was feeling shaky earlier today. Decrease strength in left arm per wife, EMT at church assessed pt prior to coming here. Equal hand grips noted in triage. Denies any HA or blurred vision.  ?

## 2021-10-05 NOTE — Consult Note (Signed)
Dr Iver Nestle notified of CT results ?

## 2021-10-05 NOTE — Assessment & Plan Note (Signed)
Blood pressure 150s to 170s. ?-Permissive hypertension, hold ramipril, bisoprolol HCTZ, Norvasc ?

## 2021-10-05 NOTE — H&P (Signed)
? ?History and Physical  ? ? ?Victor Gonzales NID:[id number] DOB: 1961/01/26 DOA: 10/05/2021 ? ?PCP: Tommie Sams, DO  ? ?Patient coming from: Home ? ?I have personally briefly reviewed patient's old medical records in [facility] ? ?Chief Complaint: Speech abnormality, difficulty writing ? ?HPI: Victor Gonzales is a 61 y.o. male with medical history significant for hypertension, diabetes mellitus, generalized anxiety disorder. ?Patient reports that about 2 weeks ago he was having some abnormality with his speech.  He reports he was able to talk and his speech came out normally and nobody noticed anything, but he felt his speech was abnormal, like it was mumbling and sluggish, with a lot of saliva in his mouth which was very unusual.  He denies any nausea or vomiting at that time or subsequently. ?Today, he tells me he woke up without any symptoms, he was feeling well. He was at church, trying to write a check, he was able to grip the pen, but he is handwriting looks like a child's handwriting, like his and writing.  He was able to grip objects and denies weakness of his hand.  No weakness of his lower extremities, he was able to walk well.  No facial dissymmetry no change in vision or speech. ? ?He has been taking aspirin 81 mg daily of the popliteal a week ago when he switched to every other day because he was getting bruised easily. ? ?ED Course: Blood pressure 140s to 170s.  Head CT shows a recent appearing left basal ganglia infarct. ?Teleneurology was called, recommended admission for stroke work-up. ? ?Review of Systems: As per HPI all other systems reviewed and negative. ? ?Past Medical History:  ?Diagnosis Date  ? Diabetes mellitus without complication (HCC)   ? GERD (gastroesophageal reflux disease)   ? Hyperlipidemia   ? Hypertension   ? ? ?Past Surgical History:  ?Procedure Laterality Date  ? COLONOSCOPY N/A 09/15/2013  ? Procedure: COLONOSCOPY;  Surgeon: West Bali, MD;  Location: [facility];   Service: Endoscopy;  Laterality: N/A;  ? NO PAST SURGERIES    ? ? ? reports that he has never smoked. He has never used smokeless tobacco. He reports that he does not drink alcohol and does not use drugs. ? ?Allergies  ?Allergen Reactions  ? Codeine   ? ? ?Family History  ?Problem Relation Age of Onset  ? Heart disease Mother   ? Diabetes Mother   ? Heart disease Brother   ? Lung disease Father   ? Cancer - Ovarian Sister   ? ? ?Prior to Admission medications   ?Medication Sig Start Date End Date Taking? Authorizing Provider  ?Accu-Chek FastClix Lancets MISC Use to check blood sugar once a day ?Dx: E11.9 07/03/21   Tommie Sams, DO  ?amLODipine (NORVASC) 10 MG tablet TAKE 1 TABLET(10 MG) BY MOUTH DAILY ?Patient not taking: Reported on 07/21/2021 01/17/21   Laroy Apple M, DO  ?aspirin 81 MG tablet Take 81 mg by mouth. Takes one every other day    [provider]  ?bisoprolol-hydrochlorothiazide [facility]) 10-6.25 MG tablet TAKE 1 TABLET BY MOUTH DAILY 08/01/21   Everlene Other G, DO  ?dapagliflozin propanediol (FARXIGA) 10 MG TABS tablet TAKE 1 TABLET BY MOUTH DAILY 04/30/21   Everlene Other G, DO  ?glimepiride (AMARYL) 4 MG tablet TAKE 2 TABLETS(8 MG) BY MOUTH DAILY 09/01/21   Everlene Other G, DO  ?glucose blood (ACCU-CHEK GUIDE) test strip USE TO CHECK BLOOD DAILY 07/21/21  Tommie Samsook, Jayce G, DO  ?Lancets Ultra Thin 30G MISC Use once a day dx: E11.9 02/25/21   Campbell RichesHoskins, Carolyn C, NP  ?metFORMIN (GLUCOPHAGE) 500 MG tablet Take 2 tablets (1,000 mg total) by mouth 2 (two) times daily. 10/01/21   Tommie Samsook, Jayce G, DO  ?Multiple Vitamin (MULTIVITAMIN) tablet Take 1 tablet by mouth daily.    [provider]  ?ramipril (ALTACE) 10 MG capsule TAKE 2 CAPSULES(20 MG) BY MOUTH DAILY 09/08/21   Tommie Samsook, Jayce G, DO  ?rosuvastatin (CRESTOR) 10 MG tablet Take 1 tablet (10 mg total) by mouth daily. 07/21/21   Tommie Samsook, Jayce G, DO  ? ? ?Physical Exam: ?Vitals:  ? 10/05/21 1311 10/05/21 1311 10/05/21 1430  ?BP:  (!) 176/87 (!) 159/89  ?Pulse:   62 65  ?Resp:  17 17  ?Temp:  97.8 ?F (36.6 ?C)   ?TempSrc:  Oral   ?SpO2:  99% 95%  ?Weight: 68.9 kg    ?Height: 5\' 7"  (1.702 m)    ? ? ?Constitutional: NAD, calm, comfortable ?Vitals:  ? 10/05/21 1311 10/05/21 1311 10/05/21 1430  ?BP:  (!) 176/87 (!) 159/89  ?Pulse:  62 65  ?Resp:  17 17  ?Temp:  97.8 ?F (36.6 ?C)   ?TempSrc:  Oral   ?SpO2:  99% 95%  ?Weight: 68.9 kg    ?Height: 5\' 7"  (1.702 m)    ? ?Eyes: PERRL, lids and conjunctivae normal ?ENMT: Mucous membranes are moist.   ?Neck: normal, supple, no masses, no thyromegaly ?Respiratory: clear to auscultation bilaterally, no wheezing, no crackles. Normal respiratory effort. No accessory muscle use.  ?Cardiovascular: Regular rate and rhythm, no murmurs / rubs / gallops. No extremity edema. 2+ pedal pulses.   ?Abdomen: no tenderness, no masses palpated. No hepatosplenomegaly. Bowel sounds positive.  ?Musculoskeletal: no clubbing / cyanosis. No joint deformity upper and lower extremities.  ?Skin: no rashes, lesions, ulcers. No induration ?Neurologic:  ?Neurological:  ?   Mental Status: he is alert.  ?   GCS: GCS eye subscore is 4. GCS verbal subscore is 5. GCS motor subscore is 6.  ?   Comments: Mental Status:  ?Alert, oriented, thought content appropriate, able to give a coherent history. Speech fluent without evidence of aphasia. Able to follow 2 step commands without difficulty.  ?Cranial Nerves:  ?II:  Peripheral visual fields grossly normal, pupils equal, round, reactive to light ?III,IV, VI: ptosis not present, extra-ocular motions intact bilaterally  ?V,VII: smile symmetric, eyebrows raise symmetric, facial light touch sensation equal ?VIII: hearing grossly normal to voice  ?X:  ?XI: bilateral shoulder shrug symmetric and strong ?XII: midline tongue extension without fassiculations ?Motor:  ?Normal tone.:  Full 5/5 strength in all extremities.  ?Sensory: Sensation intact to light touch in all extremities.  ? ?Psychiatric: Normal judgment and insight. Alert  and oriented x 3. Normal mood.  ? ?Labs on Admission: I have personally reviewed following labs and imaging studies ? ?CBC: ?Recent Labs  ?Lab 10/05/21 ?1428  ?WBC 7.3  ?NEUTROABS 3.3  ?HGB 16.8  ?HCT 50.5  ?MCV 87.1  ?PLT 161  ? ?Basic Metabolic Panel: ?Recent Labs  ?Lab 10/05/21 ?1428  ?NA 138  ?K 3.5  ?CL 107  ?CO2 24  ?GLUCOSE 86  ?BUN 15  ?CREATININE 0.72  ?CALCIUM 9.1  ? ?GFR: ?Estimated Creatinine Clearance: 89.5 mL/min (by C-G formula based on SCr of 0.72 mg/dL). ?Liver Function Tests: ?Recent Labs  ?Lab 10/05/21 ?1428  ?AST 39  ?ALT 39  ?ALKPHOS 52  ?BILITOT 1.1  ?  PROT 7.6  ?ALBUMIN 4.5  ? ?Coagulation Profile: ?Recent Labs  ?Lab 10/05/21 ?1428  ?INR 1.0  ? ?CBG: ?Recent Labs  ?Lab 10/05/21 ?1320  ?GLUCAP 120*  ? ?Radiological Exams on Admission: ?CT HEAD CODE STROKE WO CONTRAST ? ?Result Date: 10/05/2021 ?CLINICAL DATA:  Code stroke. Neuro deficit, acute, stroke suspected. Left-sided weakness. Difficulty writing today (right handed). EXAM: CT HEAD WITHOUT CONTRAST TECHNIQUE: Contiguous axial images were obtained from the base of the skull through the vertex without intravenous contrast. RADIATION DOSE REDUCTION: This exam was performed according to the departmental dose-optimization program which includes automated exposure control, adjustment of the mA and/or kV according to patient size and/or use of iterative reconstruction technique. COMPARISON:  None Available. FINDINGS: Brain: An infarct involving the left basal ganglia and corona radiata is of indeterminate age but may be acute to subacute. No acute infarct is identified in the right cerebral hemisphere or cerebellum. No intracranial hemorrhage, mass, midline shift, or extra-axial fluid collection is evident. The ventricles and sulci are normal. Vascular: No hyperdense vessel. Skull: No fracture or suspicious osseous lesion. Sinuses/Orbits: Mild left maxillary and left ethmoid sinus mucosal thickening. Clear mastoid air cells. Unremarkable orbits.  Other: None. ASPECTS [facility] Stroke Program Early CT Score) Not scored due to the patient's symptoms over the past week not consistently corresponding to one hemisphere. IMPRESSION: 1. Recent appearing left

## 2021-10-05 NOTE — Assessment & Plan Note (Signed)
-  HgbA1c ?-Hold home metformin, Farxiga, glimepiride ?-SSI-S ?

## 2021-10-05 NOTE — Consult Note (Signed)
[facility] Telemedicine Consult ? ? ?Requesting Provider: Fredia Sorrow ?Consult Participants: Otho Perl RN, Myself, patient, bedside nurse Lilia Pro ?Location of the provider: [place]  ?Location of the patient: [facility] ? ?This consult was provided via telemedicine with 2-way video and audio communication. The patient/family was informed that care would be provided in this way and agreed to receive care in this manner.  ? ?Chief Complaint: Trouble writing ? ?HPI: Victor Gonzales is a 61 year old gentleman with a past medical history significant for diabetes, hypertension, hyperlipidemia, anxiety. ? ?He reports that he has been in his usual state of health, but this morning when he woke up he had trouble writing around 930 or 10 AM.  He was last normal when he went to bed at 10 PM.  At the time of my evaluation, he feels his symptoms have fully resolved.  However he notes he also had an episode of difficulty with his speech last week which lasted about a day.  He reports his aspirin was stopped recently ? ?LKW: 10 PM 5/13 ?tpa given?: No, out of the window and hypodensity on head CT ?IR Thrombectomy? No, exam not consistent with LVO ?Modified Rankin Scale: 0-Completely asymptomatic and back to baseline post- stroke ?Time of teleneurologist evaluation: 2 PM ? ?Current Outpatient Medications  ?Medication Instructions  ? Accu-Chek FastClix Lancets MISC Use to check blood sugar once a day<BR>Dx: E11.9  ? amLODipine (NORVASC) 10 MG tablet TAKE 1 TABLET(10 MG) BY MOUTH DAILY  ? aspirin 81 mg, Oral, Takes one every other day  ? bisoprolol-hydrochlorothiazide (ZIAC) 10-6.25 MG tablet 1 tablet, Oral, Daily  ? dapagliflozin propanediol (FARXIGA) 10 MG TABS tablet TAKE 1 TABLET BY MOUTH DAILY  ? glimepiride (AMARYL) 4 MG tablet TAKE 2 TABLETS(8 MG) BY MOUTH DAILY  ? glucose blood (ACCU-CHEK GUIDE) test strip USE TO CHECK BLOOD DAILY  ? Lancets Ultra Thin 30G MISC Use once a day dx: E11.9  ? metFORMIN  (GLUCOPHAGE) 1,000 mg, Oral, 2 times daily  ? Multiple Vitamin (MULTIVITAMIN) tablet 1 tablet, Daily  ? ramipril (ALTACE) 10 MG capsule TAKE 2 CAPSULES(20 MG) BY MOUTH DAILY  ? rosuvastatin (CRESTOR) 10 mg, Oral, Daily  ? ? ? ?Exam: ?Vitals:  ? 10/05/21 1311  ?BP: (!) 176/87  ?Pulse: 62  ?Resp: 17  ?Temp: 97.8 ?F (36.6 ?C)  ?SpO2: 99%  ? ? ?General: No acute distress ?Pulmonary: breathing comfortably ?Cardiac: regular rate and rhythm on monitor  ? ?NIH Stroke scale ?1A: Level of Consciousness - 0 ?1B: Ask Month and Age - 0 ?1C: 'Blink Eyes' & 'Squeeze Hands' - 0 ?2: Test Horizontal Extraocular Movements - 0 ?3: Test Visual Fields - 0 ?4: Test Facial Palsy - 0 ?5A: Test Left Arm Motor Drift - 0 ?5B: Test Right Arm Motor Drift - 0 ?6A: Test Left Leg Motor Drift - 0 ?6B: Test Right Leg Motor Drift - 0 ?7: Test Limb Ataxia - 0 ?8: Test Sensation - 0 ?9: Test Language/Aphasia- 0 ?10: Test Dysarthria - 0 ?11: Test Extinction/Inattention - 0 ?NIHSS score: 0 ? ? ?Imaging Reviewed:  ?Age-indeterminate hypodensity in the left corona radiata on my read c/f subacute stroke, radiology read pending ? ?Labs reviewed in epic and pertinent values follow: ?Glucose 120 ?Remainder of labs reported in EMR as to be collected at this time ? ?Lab Results  ?Component Value Date  ? HGBA1C 9.0 (H) 07/14/2021  ?  ?Lab Results  ?Component Value Date  ? CHOL 200 (H) 07/14/2021  ?  HDL 52 07/14/2021  ? LDLCALC 126 (H) 07/14/2021  ? TRIG 122 07/14/2021  ? CHOLHDL 3.8 07/14/2021  ? ? ? ?Assessment: 61 year old male with past medical history of small vessel risk factors including hypertension, diabetes, hyperlipidemia.  Although his symptoms have resolved and he reports intermittent symptoms, his head CT is consistent with subacute stroke, most likely small vessel etiology given location, though I cannot rule out embolic at this time ? ?Recommendations:  ?# Left corona radiata stroke ?- Stroke labs HgbA1c, fasting lipid panel ?- MRI brain without  contrast ?- CTA head and neck if renal function is normal, else MRA of the brain without contrast and MRA neck w/wo  ?- Frequent neuro checks ?- Echocardiogram ?- Prophylactic therapy-Antiplatelet med: Aspirin - dose 325mg  PO or 300mg  PR, followed by 81 mg daily ?- Plavix 300 mg load with 75 mg daily for 21 - 90 day course to be ordered after patient passes swallow evaluation (final course TBD based on workup) ?- Risk factor modification ?- Telemetry monitoring ?- Blood pressure goal  ? - Permissive hypertension to 220/120 overnight ?- PT consult, OT consult, Speech consult, unless patient is back to baseline ?- Dr. Hortense Ramal to follow ? ?This patient is receiving care for possible acute neurological changes. There was 45 minutes of care by this provider at the time of service, including time for direct evaluation via telemedicine, review of medical records, imaging studies and discussion of findings with providers, the patient and/or family. ?Lesleigh Noe MD-PhD ?[facility] ?[phone] ?If 8pm-8am, please page neurology on call as listed in [place]. ? ?CRITICAL CARE ?Performed by: Lorenza Chick ? ? ?Total critical care time: 45 minutes ? ?Critical care time was exclusive of separately billable procedures and treating other patients. ? ?Critical care was necessary to treat or prevent imminent or life-threatening deterioration; acute neurological changes with potential to treat with thrombolytic or thrombectomy ? ?Critical care was time spent personally by me on the following activities: development of treatment plan with patient and/or surrogate as well as nursing, discussions with consultants, evaluation of patient's response to treatment, examination of patient, obtaining history from patient or surrogate, ordering and performing treatments and interventions, ordering and review of laboratory studies, ordering and review of radiographic studies, pulse oximetry and re-evaluation of patient's  condition. ? ? ?

## 2021-10-05 NOTE — Consult Note (Signed)
Modified Rankin Score 0 ?

## 2021-10-05 NOTE — Consult Note (Signed)
Telestroke paged at 1355 ?

## 2021-10-05 NOTE — Progress Notes (Signed)
Code Stroke Time Documentation ? ?1349 Call time ?1349 Beeper time ?1355 Exam started ?1359 Exam finished ?1359 Images sent to [facility] ?1403 Exam completed in Epic ?1402  [facility] Radiology Called ?

## 2021-10-05 NOTE — Consult Note (Signed)
Stroke activation time 1350 ?

## 2021-10-06 ENCOUNTER — Observation Stay (HOSPITAL_COMMUNITY): Payer: Federal, State, Local not specified - PPO

## 2021-10-06 ENCOUNTER — Observation Stay (HOSPITAL_BASED_OUTPATIENT_CLINIC_OR_DEPARTMENT_OTHER): Payer: Federal, State, Local not specified - PPO

## 2021-10-06 DIAGNOSIS — I6389 Other cerebral infarction: Secondary | ICD-10-CM

## 2021-10-06 DIAGNOSIS — I639 Cerebral infarction, unspecified: Secondary | ICD-10-CM

## 2021-10-06 LAB — HEMOGLOBIN A1C
Hgb A1c MFr Bld: 7 % — ABNORMAL HIGH (ref 4.8–5.6)
Mean Plasma Glucose: 154.2 mg/dL

## 2021-10-06 LAB — ECHOCARDIOGRAM COMPLETE
AR max vel: 2.86 cm2
AV Area VTI: 3.12 cm2
AV Area mean vel: 2.51 cm2
AV Mean grad: 2.3 mmHg
AV Peak grad: 5 mmHg
Ao pk vel: 1.12 m/s
Area-P 1/2: 3.17 cm2
Height: 67 in
S' Lateral: 3 cm
Weight: 2432 oz

## 2021-10-06 LAB — LIPID PANEL
Cholesterol: 90 mg/dL (ref 0–200)
HDL: 41 mg/dL (ref >40)
LDL Cholesterol: 34 mg/dL (ref 0–99)
Total CHOL/HDL Ratio: 2.2 RATIO
Triglycerides: 77 mg/dL (ref <150)
VLDL: 15 mg/dL (ref 0–40)

## 2021-10-06 LAB — GLUCOSE, CAPILLARY
Glucose-Capillary: 116 mg/dL — ABNORMAL HIGH (ref 70–99)
Glucose-Capillary: 258 mg/dL — ABNORMAL HIGH (ref 70–99)

## 2021-10-06 LAB — HIV ANTIBODY (ROUTINE TESTING W REFLEX): HIV Screen 4th Generation wRfx: NONREACTIVE

## 2021-10-06 MED ORDER — ASPIRIN 81 MG PO TABS: 81.0000 mg | ORAL_TABLET | Freq: Every day | ORAL | 3 refills | Status: AC

## 2021-10-06 MED ORDER — CLOPIDOGREL BISULFATE 75 MG PO TABS
75.0000 mg | ORAL_TABLET | Freq: Every day | ORAL | 2 refills | Status: DC
Start: 2021-10-07 — End: 2021-12-16

## 2021-10-06 NOTE — Discharge Summary (Signed)
?[facility] ? ?Physician Discharge Summary  ? ?Patient ID: ?Victor Gonzales ?MRN: [id number] ?DOB/AGE: 30-Mar-1961 61 y.o. ? ?Admit date: 10/05/2021 ?Discharge date: 10/06/2021   ? ?PCP: Tommie Samsook, Jayce G, DO ? ?DISCHARGE DIAGNOSES:  ?Acute ischemic stroke, left basal ganglia  ?Intracranial stenosis ?Essential hypertension ?Diabetes mellitus type 2, without complications ?Hyperlipidemia ? ? ?RECOMMENDATIONS FOR OUTPATIENT FOLLOW UP: ?Ambulatory referral sent to neurology ? ? ? ?Home Health: None ?Equipment/Devices: None ? ?CODE STATUS: Full code ? ?DISCHARGE CONDITION: fair ? ?Diet recommendation: Modified carbohydrate ? ?INITIAL HISTORY: ?61 y.o. male with medical history significant for hypertension, diabetes mellitus, generalized anxiety disorder. ?Patient reports that about 2 weeks ago he was having some abnormality with his speech.  He reports he was able to talk and his speech came out normally and nobody noticed anything, but he felt his speech was abnormal, like it was mumbling and sluggish, with a lot of saliva in his mouth which was very unusual.  He denies any nausea or vomiting at that time or subsequently. ?Today, he tells me he woke up without any symptoms, he was feeling well. He was at church, trying to write a check, he was able to grip the pen, but he is handwriting looks like a child's handwriting, like his and writing.  He was able to grip objects and denies weakness of his hand.  No weakness of his lower extremities, he was able to walk well.  No facial dissymmetry no change in vision or speech. ?  ?He has been taking aspirin 81 mg daily of the popliteal a week ago when he switched to every other day because he was getting bruised easily. ?  ?ED Course: Blood pressure 140s to 170s.  Head CT shows a recent appearing left basal ganglia infarct. ?Teleneurology was called, recommended admission for stroke work-up. ? ?Consultations: ?Neurology ? ?Procedures: ? ?Transthoracic echocardiogram ? ?HOSPITAL  COURSE:  ? ?Acute ischemic stroke ?Noted to have a ischemic stroke in the left basal ganglia.  Patient was on aspirin prior to admission.  Neurology was consulted.  MRI brain confirmed the finding.  CT angiogram head and neck was performed.  Intracranial stenosis was noted.  No intervention recommended by neurology except for medical management. ?Recommendation is for aspirin and Plavix for 3 months followed by aspirin alone. ?LDL 34.  Continue statin. ?Underwent echocardiogram which showed normal systolic function. ?HbA1c 7.0. ?PT and OT evaluated the patient.  No needs identified. ? ?His other chronic health problems including essential hypertension, hyperlipidemia and diabetes mellitus remained stable. ? ?Patient is stable.  Okay for discharge home today. ? ? ?PERTINENT LABS: ? ?The results of significant diagnostics from this hospitalization (including imaging, microbiology, ancillary and laboratory) are listed below for reference.   ? ?Microbiology: ?Recent Results (from the past 240 hour(s))  ?Resp Panel by RT-PCR (Flu A&B, Covid) Nasopharyngeal Swab     Status: None  ? Collection Time: 10/05/21  3:58 PM  ? Specimen: Nasopharyngeal Swab; Nasopharyngeal(NP) swabs in vial transport medium  ?Result Value Ref Range Status  ? SARS Coronavirus 2 by RT PCR NEGATIVE NEGATIVE Final  ?  Comment: (NOTE) ?SARS-CoV-2 target nucleic acids are NOT DETECTED. ? ?The SARS-CoV-2 RNA is generally detectable in upper respiratory ?specimens during the acute phase of infection. The lowest ?concentration of SARS-CoV-2 viral copies this assay can detect is ?138 copies/mL. A negative result does not preclude SARS-Cov-2 ?infection and should not be used as the sole basis for treatment or ?other patient management decisions. A negative  result may occur with  ?improper specimen collection/handling, submission of specimen other ?than nasopharyngeal swab, presence of viral mutation(s) within the ?areas targeted by this assay, and inadequate  number of viral ?copies(<138 copies/mL). A negative result must be combined with ?clinical observations, patient history, and epidemiological ?information. The expected result is Negative. ? ?Fact Sheet for Patients:  ?BloggerCourse.com ? ?Fact Sheet for Healthcare Providers:  ?SeriousBroker.it ? ?This test is no t yet approved or cleared by the [place] FDA and  ?has been authorized for detection and/or diagnosis of SARS-CoV-2 by ?FDA under an Emergency Use Authorization (EUA). This EUA will remain  ?in effect (meaning this test can be used) for the duration of the ?COVID-19 declaration under Section 564(b)(1) of the Act, 21 ?U.S.C.section 360bbb-3(b)(1), unless the authorization is terminated  ?or revoked sooner.  ? ? ?  ? Influenza A by PCR NEGATIVE NEGATIVE Final  ? Influenza B by PCR NEGATIVE NEGATIVE Final  ?  Comment: (NOTE) ?The Xpert Xpress SARS-CoV-2/FLU/RSV plus assay is intended as an aid ?in the diagnosis of influenza from Nasopharyngeal swab specimens and ?should not be used as a sole basis for treatment. Nasal washings and ?aspirates are unacceptable for Xpert Xpress SARS-CoV-2/FLU/RSV ?testing. ? ?Fact Sheet for Patients: ?BloggerCourse.com ? ?Fact Sheet for Healthcare Providers: ?SeriousBroker.it ? ?This test is not yet approved or cleared by the [place] FDA and ?has been authorized for detection and/or diagnosis of SARS-CoV-2 by ?FDA under an Emergency Use Authorization (EUA). This EUA will remain ?in effect (meaning this test can be used) for the duration of the ?COVID-19 declaration under Section 564(b)(1) of the Act, 21 U.S.C. ?section 360bbb-3(b)(1), unless the authorization is terminated or ?revoked. ? ?Performed at [facility], [address], [place], [place] [address] ?  ?  ? ?Labs: ? ?COVID-19 Labs ? ? ?Lab Results  ?Component Value Date  ? SARSCOV2NAA NEGATIVE 10/05/2021   ? ? ? ? ?Basic Metabolic Panel: ?Recent Labs  ?Lab 10/05/21 ?1428  ?NA 138  ?K 3.5  ?CL 107  ?CO2 24  ?GLUCOSE 86  ?BUN 15  ?CREATININE 0.72  ?CALCIUM 9.1  ? ?Liver Function Tests: ?Recent Labs  ?Lab 10/05/21 ?1428  ?AST 39  ?ALT 39  ?ALKPHOS 52  ?BILITOT 1.1  ?PROT 7.6  ?ALBUMIN 4.5  ? ? ?CBC: ?Recent Labs  ?Lab 10/05/21 ?1428  ?WBC 7.3  ?NEUTROABS 3.3  ?HGB 16.8  ?HCT 50.5  ?MCV 87.1  ?PLT 161  ? ? ?CBG: ?Recent Labs  ?Lab 10/05/21 ?1320 10/05/21 ?1618 10/05/21 ?2003 10/06/21 ?0734 10/06/21 ?1120  ?GLUCAP 120* 77 146* 116* 258*  ? ? ? ?IMAGING STUDIES ?CT ANGIO HEAD NECK W WO CM ? ?Result Date: 10/05/2021 ?CLINICAL DATA:  Stroke/TIA, determine embolic source. Difficulty writing today with symptoms now resolved. EXAM: CT ANGIOGRAPHY HEAD AND NECK TECHNIQUE: Multidetector CT imaging of the head and neck was performed using the standard protocol during bolus administration of intravenous contrast. Multiplanar CT image reconstructions and MIPs were obtained to evaluate the vascular anatomy. Carotid stenosis measurements (when applicable) are obtained utilizing NASCET criteria, using the distal internal carotid diameter as the denominator. RADIATION DOSE REDUCTION: This exam was performed according to the departmental dose-optimization program which includes automated exposure control, adjustment of the mA and/or kV according to patient size and/or use of iterative reconstruction technique. CONTRAST:  16mL OMNIPAQUE IOHEXOL 350 MG/ML SOLN COMPARISON:  None Available. FINDINGS: CTA NECK FINDINGS Aortic arch: Standard 3 vessel aortic arch with mild atherosclerotic plaque. Wide patency of the  brachiocephalic and subclavian arteries. Right carotid system: Patent with a small amount of calcified and soft plaque in the carotid bulb. No evidence of a significant stenosis or dissection. Left carotid system: Patent with a small amount of calcified and soft plaque at the carotid bifurcation. No evidence of a significant stenosis  or dissection. Vertebral arteries: Patent and codominant without evidence of stenosis, dissection, or significant atherosclerosis. Skeleton: No suspicious osseous lesion. Other neck: Small exophytic thyroid n

## 2021-10-06 NOTE — Progress Notes (Signed)
PT Cancellation Note ? ?Patient Details ?Name: Victor Gonzales ?MRN: [id number] ?DOB: 1961-05-23 ? ? ?Cancelled Treatment:    Reason Eval/Treat Not Completed: PT screened, no needs identified, will sign off. Patient up ad lib without assistive devices in room per nursing, wife and patient. ? ?[name]. Hartnett-Rands, MS, PT ?Per Darryl Lent PT [facility] ?[place] #[id number] ? ?Teneshia Hedeen  Hartnett-Rands ?10/06/2021, 10:05 AM ?

## 2021-10-06 NOTE — Progress Notes (Signed)
?  Echocardiogram ?2D Echocardiogram has been performed. ? Victor Gonzales ?10/06/2021, 1:45 PM ?

## 2021-10-06 NOTE — Progress Notes (Signed)
OT Cancellation Note ? ?Patient Details ?Name: Victor Gonzales ?MRN: [id number] ?DOB: 1961/04/02 ? ? ?Cancelled Treatment:    Reason Eval/Treat Not Completed: OT screened, no needs identified, will sign off. Pt at baseline, BUE strength WNL, coordination intact. Handwriting much improved, 100% legible. No further OT services required at this time.  ? ?Ezra Sites, OTR/L  ?[phone] ?10/06/2021, 11:28 AM ?

## 2021-10-06 NOTE — Progress Notes (Signed)
I connected with  Victor Gonzales on 10/06/21 by a video enabled telemedicine application and verified that I am speaking with the correct person using two identifiers. ?  ?I discussed the limitations of evaluation and management by telemedicine. The patient expressed understanding and agreed to proceed. ? ?Location of patient: [facility] ?Location of physician: [facility] ? ? ?Subjective: No new concerns.  Continues to have some weakness in the right hand per patient. ? ?ROS: negative except above ? ?Examination ? ?Vital signs in last 24 hours: ?Temp:  [97.5 ?F (36.4 ?C)-98.8 ?F (37.1 ?C)] 97.7 ?F (36.5 ?C) (05/15 0402) ?Pulse Rate:  [61-70] 64 (05/15 0734) ?Resp:  [12-20] 17 (05/15 0734) ?BP: (142-176)/(72-98) 150/82 (05/15 0734) ?SpO2:  [95 %-99 %] 97 % (05/15 0734) ?Weight:  [68.9 kg] 68.9 kg (05/14 1311) ? ?General: lying in bed, NAD ?Neuro: AOx3, no aphasia, cranial nerves II 12 grossly intact, antigravity strength in all 4 extremities without drift, FTN intact bilaterally, sensation intact to light touch ? ? ?Basic Metabolic Panel: ?Recent Labs  ?Lab 10/05/21 ?1428  ?NA 138  ?K 3.5  ?CL 107  ?CO2 24  ?GLUCOSE 86  ?BUN 15  ?CREATININE 0.72  ?CALCIUM 9.1  ? ? ?CBC: ?Recent Labs  ?Lab 10/05/21 ?1428  ?WBC 7.3  ?NEUTROABS 3.3  ?HGB 16.8  ?HCT 50.5  ?MCV 87.1  ?PLT 161  ? ? ? ?Coagulation Studies: ?Recent Labs  ?  10/05/21 ?1428  ?LABPROT 13.2  ?INR 1.0  ? ? ?Imaging ?CT angio head and neck with and without contrast: 10/05/2021: Mild atherosclerosis in the head and neck without a large vessel ?occlusion. Mild right M1 and right A1 stenoses. Widely patent carotid and vertebral arteries. Aortic Atherosclerosis (ICD10-I70.0). ? ?MRI brain without contrast 10/06/2021: ? ? ? ? ? ?ASSESSMENT AND PLAN: 61 year old male presented with trouble writing. ? ?Acute ischemic stroke, left basal ganglia ?Intracranial stenosis ?Hypertension ?Diabetes ?Hyperlipidemia ?-Stroke etiology: Most likely small vessel disease   ?-TTE ordered and pending ? ?Recommendations ?-Aspirin 81 mg daily as well as Plavix 75 mg daily for 3 months followed by aspirin 81 mg daily ?-LDL 34, therefore recommend continuing rosuvastatin 10 mg daily ?-Goal blood pressure: Normotension as symptoms have already resolved ?-Stat CT head for any neurologic change ?-PT/OT/SLP ?-Stroke education including BEFAST ?-Okay to discharge from neurology standpoint if TTE normal ?-Ambulatory referral to neurology upon discharge ? ?I have spent a total of   40 minutes with the patient reviewing hospital notes,  test results, labs and examining the patient as well as establishing an assessment and plan that was discussed personally with the patient.  > 50% of time was spent in direct patient care. ?  ?Zeb Comfort ?Epilepsy ?[facility] ?For questions after 5pm please refer to AMION to reach the Neurologist on call ? ?

## 2021-10-06 NOTE — TOC Progression Note (Signed)
Transition of Care (TOC) - Progression Note  ? ? ?Patient Details  ?Name: Victor Gonzales ?MRN: [id number] ?Date of Birth: 24-Oct-1960 ? ?Transition of Care (TOC) CM/SW Contact  ?Karn Cassis, LCSW ?Phone Number: ?10/06/2021, 11:02 AM ? ?Clinical Narrative:   ?Transition of Care (TOC) Screening Note ? ? ?Patient Details  ?Name: Victor Gonzales ?Date of Birth: [birth year] ? ? ?Transition of Care (TOC) CM/SW Contact:    ?Karn Cassis, LCSW ?Phone Number: ?10/06/2021, 11:03 AM ? ? ? ?Transition of Care Department [facility]) has reviewed patient and no TOC needs have been identified at this time. We will continue to monitor patient advancement through interdisciplinary progression rounds. If new patient transition needs arise, please place a TOC consult. ?   ? ? ? ?  ?  ? ?Expected Discharge Plan and Services ?  ?  ?  ?  ?  ?                ?  ?  ?  ?  ?  ?  ?  ?  ?  ?  ? ? ?Social Determinants of Health (SDOH) Interventions ?  ? ?Readmission Risk Interventions ?   ? View : No data to display.  ?  ?  ?  ? ? ?

## 2021-10-23 ENCOUNTER — Encounter: Payer: Self-pay | Admitting: Family Medicine

## 2021-10-23 ENCOUNTER — Ambulatory Visit: Payer: Federal, State, Local not specified - PPO | Admitting: Family Medicine

## 2021-10-23 DIAGNOSIS — E785 Hyperlipidemia, unspecified: Secondary | ICD-10-CM | POA: Diagnosis not present

## 2021-10-23 DIAGNOSIS — E119 Type 2 diabetes mellitus without complications: Secondary | ICD-10-CM | POA: Diagnosis not present

## 2021-10-23 DIAGNOSIS — I6930 Unspecified sequelae of cerebral infarction: Secondary | ICD-10-CM

## 2021-10-23 DIAGNOSIS — I639 Cerebral infarction, unspecified: Secondary | ICD-10-CM

## 2021-10-23 DIAGNOSIS — I10 Essential (primary) hypertension: Secondary | ICD-10-CM | POA: Diagnosis not present

## 2021-10-23 MED ORDER — AMLODIPINE BESYLATE 5 MG PO TABS
5.0000 mg | ORAL_TABLET | Freq: Every day | ORAL | 0 refills | Status: DC
Start: 2021-10-23 — End: 2021-11-24

## 2021-10-23 MED ORDER — BISOPROLOL-HYDROCHLOROTHIAZIDE 10-6.25 MG PO TABS: 1.0000 | ORAL_TABLET | Freq: Every day | ORAL | 0 refills | Status: DC

## 2021-10-23 NOTE — Patient Instructions (Signed)
Medications as directed.  Follow up in 1 month.  Check BP regularly at home.  Take care  Dr. Adriana Simas

## 2021-10-24 NOTE — Assessment & Plan Note (Signed)
At goal. Continue current medications. 

## 2021-10-24 NOTE — Assessment & Plan Note (Signed)
At goal. Continue statin.

## 2021-10-24 NOTE — Assessment & Plan Note (Signed)
Patient is doing well at this time.  After discussion, patient would like to return to work with restrictions.  Note given.  I advised him to talk with his employer and if he needs paperwork to be filled out I am happy to do so.

## 2021-10-24 NOTE — Assessment & Plan Note (Signed)
BP is elevated here today.  Increasing dosing of Norvasc.  Continue Ziac.  Continue ramipril.  Follow-up in 1 month.

## 2021-10-24 NOTE — Progress Notes (Signed)
Subjective:  Patient ID: Victor Gonzales, male    DOB: Feb 12, 1961  Age: 61 y.o. MRN: [id number]  CC: Chief Complaint  Patient presents with   Diabetes    Pt recently in hospital for stroke. 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 Pt states his energy has still not came back fully.     HPI:  61 year old male with hypertension, type 2 diabetes, hepatic steatosis, hyperlipidemia, anxiety presents for hospital follow-up.  Patient presented with abnormalities in his speech.  Subsequently found to have CVA (left basal ganglia infarct).  Needs a thorough work-up and was discharged home on aspirin and Plavix.  Patient's blood pressure medications were changed on discharge.  Patient's lipids are at goal.  A1c also at goal of 7.0.  Blood pressure has been elevated.  He states that amlodipine was discontinued.  He has still been taking the Ziac that he was previously prescribed although this was discontinued based off of his discharge summary.  He continues on ramipril.  Patient is concerned about being able to return to his normal duties at work.  He is contemplating disability.  He would like to discuss this today.  Patient Active Problem List   Diagnosis Date Noted   CVA (cerebral vascular accident) (HCC) 10/05/2021   Hyperlipidemia 07/21/2021   Type 2 diabetes mellitus without complications (HCC) 07/21/2021   Fatty liver 03/25/2020   Insomnia 04/26/2013   Generalized anxiety disorder 01/26/2013   HTN (hypertension) 10/26/2012    Social Hx   Social History   Socioeconomic History   Marital status: Married    Spouse name: Not on file   Number of children: Not on file   Years of education: Not on file   Highest education level: Not on file  Occupational History   Not on file  Tobacco Use   Smoking status: Never   Smokeless tobacco: Never  Substance and Sexual Activity   Alcohol use: No   Drug use: No   Sexual activity: Not  on file  Other Topics Concern   Not on file  Social History Narrative   Not on file   Social Determinants of Health   Financial Resource Strain: Not on file  Food Insecurity: Not on file  Transportation Needs: Not on file  Physical Activity: Not on file  Stress: Not on file  Social Connections: Not on file    Review of Systems  Constitutional: Negative.   Respiratory: Negative.    Cardiovascular: Negative.    Objective:  BP (!) 142/88   Pulse 71   Temp 97.6 F (36.4 C)   Wt 153 lb 9.6 oz (69.7 kg)   SpO2 94%   BMI 24.06 kg/m      10/23/2021    3:50 PM 10/23/2021    3:04 PM 10/06/2021   12:13 PM  BP/Weight  Systolic BP 142 174 152  Diastolic BP 88 91 73  Wt. (Lbs)  153.6   BMI  24.06 kg/m2     Physical Exam Vitals and nursing note reviewed.  Constitutional:      General: He is not in acute distress.    Appearance: Normal appearance. He is not ill-appearing.  HENT:     Head: Normocephalic and atraumatic.  Eyes:     General:        Right eye: No discharge.        Left eye: No discharge.  Conjunctiva/sclera: Conjunctivae normal.  Cardiovascular:     Rate and Rhythm: Normal rate and regular rhythm.  Pulmonary:     Effort: Pulmonary effort is normal.     Breath sounds: Normal breath sounds. No wheezing, rhonchi or rales.  Neurological:     Mental Status: He is alert.  Psychiatric:        Mood and Affect: Mood normal.        Behavior: Behavior normal.    Lab Results  Component Value Date   WBC 7.3 10/05/2021   HGB 16.8 10/05/2021   HCT 50.5 10/05/2021   PLT 161 10/05/2021   GLUCOSE 86 10/05/2021   CHOL 90 10/06/2021   TRIG 77 10/06/2021   HDL 41 10/06/2021   LDLCALC 34 10/06/2021   ALT 39 10/05/2021   AST 39 10/05/2021   NA 138 10/05/2021   K 3.5 10/05/2021   CL 107 10/05/2021   CREATININE 0.72 10/05/2021   BUN 15 10/05/2021   CO2 24 10/05/2021   PSA 0.31 05/31/2014   INR 1.0 10/05/2021   HGBA1C 7.0 (H) 10/06/2021   MICROALBUR 2.60 (H)  04/29/2013     Assessment & Plan:   Problem List Items Addressed This Visit       Cardiovascular and Mediastinum   HTN (hypertension)    BP is elevated here today.  Increasing dosing of Norvasc.  Continue Ziac.  Continue ramipril.  Follow-up in 1 month.       Relevant Medications   amLODipine (NORVASC) 5 MG tablet   bisoprolol-hydrochlorothiazide (ZIAC) 10-6.25 MG tablet   CVA (cerebral vascular accident) [facility])    Patient is doing well at this time.  After discussion, patient would like to return to work with restrictions.  Note given.  I advised him to talk with his employer and if he needs paperwork to be filled out I am happy to do so.       Relevant Medications   amLODipine (NORVASC) 5 MG tablet   bisoprolol-hydrochlorothiazide (ZIAC) 10-6.25 MG tablet     Endocrine   Type 2 diabetes mellitus without complications (HCC)    At goal.  Continue current medications.       Relevant Medications   bisoprolol-hydrochlorothiazide (ZIAC) 10-6.25 MG tablet     Other   Hyperlipidemia    At goal.  Continue statin.       Relevant Medications   amLODipine (NORVASC) 5 MG tablet   bisoprolol-hydrochlorothiazide (ZIAC) 10-6.25 MG tablet    Meds ordered this encounter  Medications   amLODipine (NORVASC) 5 MG tablet    Sig: Take 1 tablet (5 mg total) by mouth daily.    Dispense:  90 tablet    Refill:  0   bisoprolol-hydrochlorothiazide (ZIAC) 10-6.25 MG tablet    Sig: Take 1 tablet by mouth daily.    Dispense:  90 tablet    Refill:  0    Follow-up:  1 month  Arcola Freshour DO [facility]

## 2021-11-04 ENCOUNTER — Other Ambulatory Visit: Payer: Self-pay | Admitting: Family Medicine

## 2021-11-05 ENCOUNTER — Encounter: Payer: Self-pay | Admitting: Family Medicine

## 2021-11-24 ENCOUNTER — Ambulatory Visit: Payer: Federal, State, Local not specified - PPO | Admitting: Family Medicine

## 2021-11-24 VITALS — BP 158/81 | HR 68 | Temp 97.9°F | Ht 67.0 in | Wt 154.4 lb

## 2021-11-24 DIAGNOSIS — I2510 Atherosclerotic heart disease of native coronary artery without angina pectoris: Secondary | ICD-10-CM

## 2021-11-24 DIAGNOSIS — I10 Essential (primary) hypertension: Secondary | ICD-10-CM | POA: Diagnosis not present

## 2021-11-24 DIAGNOSIS — E785 Hyperlipidemia, unspecified: Secondary | ICD-10-CM | POA: Diagnosis not present

## 2021-11-24 DIAGNOSIS — E119 Type 2 diabetes mellitus without complications: Secondary | ICD-10-CM | POA: Diagnosis not present

## 2021-11-24 MED ORDER — AMLODIPINE BESYLATE 10 MG PO TABS: 10.0000 mg | ORAL_TABLET | Freq: Every day | ORAL | 3 refills | Status: DC

## 2021-11-24 NOTE — Patient Instructions (Signed)
I have increased your Amlodipine to 10 mg daily.  I am referring you to cardiology.  Follow up in 2 months.  Take care  Dr. Adriana Simas

## 2021-11-26 DIAGNOSIS — I2510 Atherosclerotic heart disease of native coronary artery without angina pectoris: Secondary | ICD-10-CM | POA: Insufficient documentation

## 2021-11-26 NOTE — Assessment & Plan Note (Signed)
Not quite yet under optimal control.  Increasing his amlodipine to 10 mg daily.  Patient is to continue his other medications at their current dosing.

## 2021-11-26 NOTE — Assessment & Plan Note (Signed)
Lipids are at goal.  Continue Crestor.

## 2021-11-26 NOTE — Assessment & Plan Note (Signed)
A1c has been at goal.  We will need A1c in 2 months.

## 2021-11-26 NOTE — Progress Notes (Signed)
Subjective:  Patient ID: Victor Gonzales, male    DOB: Oct 11, 1960  Age: 61 y.o. MRN: [id number]  CC: Chief Complaint  Patient presents with   Hypertension    HPI:  61 year old male with hypertension, history of CVA, type 2 diabetes, hyperlipidemia presents for follow-up regarding hypertension.  Patient brings in his home blood pressure readings today.  The majority of them are below 140/90.  He does have a few outliers.  I would like better control of his hypertension given his history.  His blood pressure is elevated today.  He is on bisoprolol/HCTZ 10-6 0.25, amlodipine 5 mg daily, and ramipril 10 mg daily.  Patient states that he would like to return to work.  He states that he has to be cleared for mild restrictions to return to work.  He denies any chest pain.  No shortness of breath.  He does note that he is not able to work as long as he used to.  He states that he often gets tired in the evenings.  No reports of diaphoresis.  Patient Active Problem List   Diagnosis Date Noted   ASCVD (arteriosclerotic cardiovascular disease) 11/26/2021   CVA (cerebral vascular accident) (HCC) 10/05/2021   Hyperlipidemia 07/21/2021   Type 2 diabetes mellitus without complications (HCC) 07/21/2021   Fatty liver 03/25/2020   Insomnia 04/26/2013   Generalized anxiety disorder 01/26/2013   HTN (hypertension) 10/26/2012    Social Hx   Social History   Socioeconomic History   Marital status: Married    Spouse name: Not on file   Number of children: Not on file   Years of education: Not on file   Highest education level: Not on file  Occupational History   Not on file  Tobacco Use   Smoking status: Never   Smokeless tobacco: Never  Substance and Sexual Activity   Alcohol use: No   Drug use: No   Sexual activity: Not on file  Other Topics Concern   Not on file  Social History Narrative   Not on file   Social Determinants of Health   Financial Resource Strain: Not on file  Food  Insecurity: Not on file  Transportation Needs: Not on file  Physical Activity: Not on file  Stress: Not on file  Social Connections: Not on file    Review of Systems Per HPI  Objective:  BP (!) 158/81   Pulse 68   Temp 97.9 F (36.6 C) (Oral)   Ht 5\' 7"  (1.702 m)   Wt 154 lb 6.4 oz (70 kg)   SpO2 99%   BMI 24.18 kg/m      11/24/2021    3:09 PM 10/23/2021    3:50 PM 10/23/2021    3:04 PM  BP/Weight  Systolic BP 158 142 174  Diastolic BP 81 88 91  Wt. (Lbs) 154.4  153.6  BMI 24.18 kg/m2  24.06 kg/m2    Physical Exam Vitals and nursing note reviewed.  Constitutional:      General: He is not in acute distress.    Appearance: Normal appearance. He is not ill-appearing.  HENT:     Head: Normocephalic and atraumatic.  Cardiovascular:     Rate and Rhythm: Normal rate and regular rhythm.  Pulmonary:     Effort: Pulmonary effort is normal.     Breath sounds: Normal breath sounds. No wheezing, rhonchi or rales.  Neurological:     Mental Status: He is alert.  Psychiatric:  Mood and Affect: Mood normal.        Behavior: Behavior normal.     Lab Results  Component Value Date   WBC 7.3 10/05/2021   HGB 16.8 10/05/2021   HCT 50.5 10/05/2021   PLT 161 10/05/2021   GLUCOSE 86 10/05/2021   CHOL 90 10/06/2021   TRIG 77 10/06/2021   HDL 41 10/06/2021   LDLCALC 34 10/06/2021   ALT 39 10/05/2021   AST 39 10/05/2021   NA 138 10/05/2021   K 3.5 10/05/2021   CL 107 10/05/2021   CREATININE 0.72 10/05/2021   BUN 15 10/05/2021   CO2 24 10/05/2021   PSA 0.31 05/31/2014   INR 1.0 10/05/2021   HGBA1C 7.0 (H) 10/06/2021   MICROALBUR 2.60 (H) 04/29/2013     Assessment & Plan:   Problem List Items Addressed This Visit       Cardiovascular and Mediastinum   HTN (hypertension) - Primary    Not quite yet under optimal control.  Increasing his amlodipine to 10 mg daily.  Patient is to continue his other medications at their current dosing.      Relevant Medications    amLODipine (NORVASC) 10 MG tablet   ASCVD (arteriosclerotic cardiovascular disease)    Referring to cardiology.  I would like him to have a stress test.      Relevant Medications   amLODipine (NORVASC) 10 MG tablet   Other Relevant Orders   Ambulatory referral to Cardiology     Endocrine   Type 2 diabetes mellitus without complications (HCC)    A1c has been at goal.  We will need A1c in 2 months.        Other   Hyperlipidemia    Lipids are at goal.  Continue Crestor.      Relevant Medications   amLODipine (NORVASC) 10 MG tablet    Meds ordered this encounter  Medications   amLODipine (NORVASC) 10 MG tablet    Sig: Take 1 tablet (10 mg total) by mouth daily.    Dispense:  90 tablet    Refill:  3    Follow-up: 2 months  Bryanne Riquelme Adriana Simas DO [facility]

## 2021-11-26 NOTE — Assessment & Plan Note (Signed)
Referring to cardiology.  I would like him to have a stress test.

## 2021-12-11 ENCOUNTER — Other Ambulatory Visit: Payer: Self-pay | Admitting: Family Medicine

## 2021-12-11 MED ORDER — GLIMEPIRIDE 4 MG PO TABS: ORAL_TABLET | ORAL | 0 refills | Status: DC

## 2021-12-16 ENCOUNTER — Ambulatory Visit: Payer: Federal, State, Local not specified - PPO | Admitting: Neurology

## 2021-12-16 ENCOUNTER — Encounter: Payer: Self-pay | Admitting: Neurology

## 2021-12-16 VITALS — BP 165/82 | HR 72 | Ht 67.5 in | Wt 152.0 lb

## 2021-12-16 DIAGNOSIS — G44209 Tension-type headache, unspecified, not intractable: Secondary | ICD-10-CM

## 2021-12-16 DIAGNOSIS — R471 Dysarthria and anarthria: Secondary | ICD-10-CM

## 2021-12-16 DIAGNOSIS — I6381 Other cerebral infarction due to occlusion or stenosis of small artery: Secondary | ICD-10-CM | POA: Diagnosis not present

## 2021-12-16 NOTE — Progress Notes (Addendum)
[facility] [address] [place]. [place] [address] [phone]       OFFICE CONSULT NOTE  [name] Date of Birth:  04-Mar-1961 Medical Record Number:  [id number]   Ref MD : Osvaldo Shipper  Reason for referral : Stroke  HPI: [name] is a 61 year old Caucasian male seen today for initial office consultation visit for stroke.  He is accompanied by his wife.  History is obtained from them and review of electronic medical records and opossum reviewed pertinent available imaging films in PACS.  He has past medical history of diabetes, hypertension, hyperlipidemia and anxiety.  Presented to [facility] emergency room on 10/05/2021 with sudden onset of trouble writing and using his right hand as well as some slurred speech when he woke up in the morning that day.  He was evaluated by telemetry neurologist Dr. Melynda Ripple and found to have resolving symptoms with NIH stroke scale of 0.  He was admitted for stroke work-up and MRI scan showed a small left basal ganglia lacunar infarct.  CT angiogram showed mild atherosclerotic changes with mild right M1 and A1 stenosis.  2D echo showed normal ejection fraction of 60 to 65% without cardiac source of embolism.  Telemetry monitoring did not reveal cardiac arrhythmias.  Hemoglobin A1c was 7.0 and LDL cholesterol was at goal at 34 mg percent.  Patient was on no antiplatelets prior to admission and he was started on dual antiplatelet therapy aspirin and Plavix for 3 weeks and then aspirin alone but he has stayed on both medications so far.  He does complain of bruising easily.  His symptoms resolved completely while he was in the hospital and has had no recurrent stroke or TIA symptoms.  He states his sugars are doing well and his fasting glucose usually are in the 110 range.  He is tolerating Crestor well without muscle aches and pains.  Blood pressure is under good control and today it is slightly high at 165/82.  He is quite active  indoors  as well as outdoors in his home.  He has not yet been able to return to work.  He has no new complaints today.  He has no prior history of strokes, TIA, migraines.  Last few weeks has been having a minor headache which is bifrontal dull aching without any accompanying light or sound sensitivity and is not disabling not requiring medications. ROS:   14 system review of systems is positive for dysarthria, and weakness, bruising, petechiae, headache all other systems negative  PMH:  Past Medical History:  Diagnosis Date   Diabetes mellitus without complication (HCC)    GERD (gastroesophageal reflux disease)    Hyperlipidemia    Hypertension     Social History:  Social History   Socioeconomic History   Marital status: Married    Spouse name: Not on file   Number of children: Not on file   Years of education: Not on file   Highest education level: Not on file  Occupational History   Not on file  Tobacco Use   Smoking status: Never   Smokeless tobacco: Never  Substance and Sexual Activity   Alcohol use: No   Drug use: No   Sexual activity: Not on file  Other Topics Concern   Not on file  Social History Narrative   Not on file   Social Determinants of Health   Financial Resource Strain: Not on file  Food Insecurity: Not on file  Transportation Needs: Not  on file  Physical Activity: Not on file  Stress: Not on file  Social Connections: Not on file  Intimate Partner Violence: Not on file    Medications:   Current Outpatient Medications on File Prior to Visit  Medication Sig Dispense Refill   Accu-Chek FastClix Lancets MISC Use to check blood sugar once a day Dx: E11.9 100 each 1   amLODipine (NORVASC) 10 MG tablet Take 1 tablet (10 mg total) by mouth daily. 90 tablet 3   aspirin 81 MG tablet Take 1 tablet (81 mg total) by mouth daily. Takes one every other day 30 tablet 3   bisoprolol-hydrochlorothiazide (ZIAC) 10-6.25 MG tablet Take 1 tablet by mouth daily. 90  tablet 0   dapagliflozin propanediol (FARXIGA) 10 MG TABS tablet TAKE 1 TABLET BY MOUTH DAILY (Patient taking differently: Take 10 mg by mouth daily.) 90 tablet 0   glimepiride (AMARYL) 4 MG tablet TAKE 2 TABLETS(8 MG) BY MOUTH DAILY 180 tablet 0   Lancets Ultra Thin 30G MISC Use once a day dx: E11.9 30 each 1   MAGNESIUM PO Take by mouth daily.     metFORMIN (GLUCOPHAGE) 500 MG tablet Take 2 tablets (1,000 mg total) by mouth 2 (two) times daily. 360 tablet 0   Multiple Vitamin (MULTIVITAMIN) tablet Take 1 tablet by mouth daily.     ramipril (ALTACE) 10 MG capsule TAKE 2 CAPSULES(20 MG) BY MOUTH DAILY 180 capsule 0   rosuvastatin (CRESTOR) 10 MG tablet Take 1 tablet (10 mg total) by mouth daily. 90 tablet 3   No current facility-administered medications on file prior to visit.    Allergies:   Allergies  Allergen Reactions   Codeine Other (See Comments)    Unknown    Physical Exam General: well developed, well nourished, pleasant middle-age Caucasian male seated, in no evident distress Head: head normocephalic and atraumatic.  Neck: supple with no carotid or supraclavicular bruits Cardiovascular: regular rate and rhythm, no murmurs Musculoskeletal: no deformity Skin:  no rash scattered bilateral forearm petichiae Vascular:  Normal pulses all extremities Vitals:   12/16/21 0900  BP: (!) 165/82  Pulse: 72   Neurologic Exam Mental Status: Awake and fully alert. Oriented to place and time. Recent and remote memory intact. Attention span, concentration and fund of knowledge appropriate. Mood and affect appropriate.  Cranial Nerves: Fundoscopic exam reveals sharp disc margins. Pupils equal, briskly reactive to light. Extraocular movements full without nystagmus. Visual fields full to confrontation. Hearing intact. Facial sensation intact. Face, tongue, palate moves normally and symmetrically.  Motor: Normal bulk and tone. Normal strength in all tested extremity muscles. Sensory.: intact  to touch ,pinprick .position and vibratory sensation.  Coordination: Rapid alternating movements normal in all extremities. Finger-to-nose and heel-to-shin performed accurately bilaterally. Gait and Station: Arises from chair without difficulty. Stance is normal. Gait demonstrates normal stride length and balance . Able to heel, toe and tandem walk without difficulty.  Reflexes: 1+ and symmetric. Toes downgoing.   NIHSS  0 Modified Rankin  1   ASSESSMENT: 61 year old Caucasian male with transient episode of dysarthria and right hand weakness and May 2023 due to left subcortical infarct from small vessel disease.  Vascular risk factors of diabetes, hypertension and hyperlipidemia.  Patient is doing well from neurovascular standpoint with near complete recovery.  New onset mild bifrontal and occipital headaches for the last 2 to 3 weeks which is nondisabling and likely tension headache     PLAN:I had a long d/w patient and his wife about  his recent lacunar stroke, risk for recurrent stroke/TIAs, personally independently reviewed imaging studies and stroke evaluation results and answered questions.Continue aspirin 81 mg daily alone and now stopped Plavix for secondary stroke prevention and maintain strict control of hypertension with blood pressure goal below 130/90, diabetes with hemoglobin A1c goal below 6.5% and lipids with LDL cholesterol goal below 70 mg/dL. I also advised the patient to eat a healthy diet with plenty of whole grains, cereals, fruits and vegetables, exercise regularly and maintain ideal body weight.  He was advised to drink plenty of fluids and keep himself well-hydrated and if his headaches got worse he was advised to call for further advice.  Followup in the future with me in 6 months or call earlier if necessary.Greater than 50% of time during this 45 minute consultation visit was spent on counseling,explanation of diagnosis of lacunar stroke, planning of further management,  discussion with patient and family and coordination of care Delia Heady, MD Note: This document was prepared with digital dictation and possible smart phrase technology. Any transcriptional errors that result from this process are unintentional

## 2021-12-16 NOTE — Patient Instructions (Signed)
I had a long d/w patient and his wife about his recent lacunar stroke, risk for recurrent stroke/TIAs, personally independently reviewed imaging studies and stroke evaluation results and answered questions.Continue aspirin 81 mg daily alone and now stopped Plavix for secondary stroke prevention and maintain strict control of hypertension with blood pressure goal below 130/90, diabetes with hemoglobin A1c goal below 6.5% and lipids with LDL cholesterol goal below 70 mg/dL. I also advised the patient to eat a healthy diet with plenty of whole grains, cereals, fruits and vegetables, exercise regularly and maintain ideal body weight Followup in the future with me in 6 months or call earlier if necessary. Stroke Prevention Some medical conditions and behaviors can lead to a higher chance of having a stroke. You can help prevent a stroke by eating healthy, exercising, not smoking, and managing any medical conditions you have. Stroke is a leading cause of functional impairment. Primary prevention is particularly important because a majority of strokes are first-time events. Stroke changes the lives of not only those who experience a stroke but also their family and other caregivers. How can this condition affect me? A stroke is a medical emergency and should be treated right away. A stroke can lead to brain damage and can sometimes be life-threatening. If a person gets medical treatment right away, there is a better chance of surviving and recovering from a stroke. What can increase my risk? The following medical conditions may increase your risk of a stroke: Cardiovascular disease. High blood pressure (hypertension). Diabetes. High cholesterol. Sickle cell disease. Blood clotting disorders (hypercoagulable state). Obesity. Sleep disorders (obstructive sleep apnea). Other risk factors include: Being older than age 88. Having a history of blood clots, stroke, or mini-stroke (transient ischemic attack,  TIA). Genetic factors, such as race, ethnicity, or a family history of stroke. Smoking cigarettes or using other tobacco products. Taking birth control pills, especially if you also use tobacco. Heavy use of alcohol or drugs, especially cocaine and methamphetamine. Physical inactivity. What actions can I take to prevent this? Manage your health conditions High cholesterol levels. Eating a healthy diet is important for preventing high cholesterol. If cholesterol cannot be managed through diet alone, you may need to take medicines. Take any prescribed medicines to control your cholesterol as told by your health care provider. Hypertension. To reduce your risk of stroke, try to keep your blood pressure below 130/80. Eating a healthy diet and exercising regularly are important for controlling blood pressure. If these steps are not enough to manage your blood pressure, you may need to take medicines. Take any prescribed medicines to control hypertension as told by your health care provider. Ask your health care provider if you should monitor your blood pressure at home. Have your blood pressure checked every year, even if your blood pressure is normal. Blood pressure increases with age and some medical conditions. Diabetes. Eating a healthy diet and exercising regularly are important parts of managing your blood sugar (glucose). If your blood sugar cannot be managed through diet and exercise, you may need to take medicines. Take any prescribed medicines to control your diabetes as told by your health care provider. Get evaluated for obstructive sleep apnea. Talk to your health care provider about getting a sleep evaluation if you snore a lot or have excessive sleepiness. Make sure that any other medical conditions you have, such as atrial fibrillation or atherosclerosis, are managed. Nutrition Follow instructions from your health care provider about what to eat or drink to help manage your  health  condition. These instructions may include: Reducing your daily calorie intake. Limiting how much salt (sodium) you use to 1,500 milligrams (mg) each day. Using only healthy fats for cooking, such as olive oil, canola oil, or sunflower oil. Eating healthy foods. You can do this by: Choosing foods that are high in fiber, such as whole grains, and fresh fruits and vegetables. Eating at least 5 servings of fruits and vegetables a day. Try to fill one-half of your plate with fruits and vegetables at each meal. Choosing lean protein foods, such as lean cuts of meat, poultry without skin, fish, tofu, beans, and nuts. Eating low-fat dairy products. Avoiding foods that are high in sodium. This can help lower blood pressure. Avoiding foods that have saturated fat, trans fat, and cholesterol. This can help prevent high cholesterol. Avoiding processed and prepared foods. Counting your daily carbohydrate intake.  Lifestyle If you drink alcohol: Limit how much you have to: 0-1 drink a day for women who are not pregnant. 0-2 drinks a day for men. Know how much alcohol is in your drink. In the [place] one drink equals one 12 oz bottle of beer (320mL), one 5 oz glass of wine (19mL), or one 1 oz glass of hard liquor (62mL). Do not use any products that contain nicotine or tobacco. These products include cigarettes, chewing tobacco, and vaping devices, such as e-cigarettes. If you need help quitting, ask your health care provider. Avoid secondhand smoke. Do not use drugs. Activity  Try to stay at a healthy weight. Get at least 30 minutes of exercise on most days, such as: Fast walking. Biking. Swimming. Medicines Take over-the-counter and prescription medicines only as told by your health care provider. Aspirin or blood thinners (antiplatelets or anticoagulants) may be recommended to reduce your risk of forming blood clots that can lead to stroke. Avoid taking birth control pills. Talk to your health  care provider about the risks of taking birth control pills if: You are over 107 years old. You smoke. You get very bad headaches. You have had a blood clot. Where to find more information American Stroke Association: www.strokeassociation.org Get help right away if: You or a loved one has any symptoms of a stroke. "BE FAST" is an easy way to remember the main warning signs of a stroke: B - Balance. Signs are dizziness, sudden trouble walking, or loss of balance. E - Eyes. Signs are trouble seeing or a sudden change in vision. F - Face. Signs are sudden weakness or numbness of the face, or the face or eyelid drooping on one side. A - Arms. Signs are weakness or numbness in an arm. This happens suddenly and usually on one side of the body. S - Speech. Signs are sudden trouble speaking, slurred speech, or trouble understanding what people say. T - Time. Time to call emergency services. Write down what time symptoms started. You or a loved one has other signs of a stroke, such as: A sudden, severe headache with no known cause. Nausea or vomiting. Seizure. These symptoms may represent a serious problem that is an emergency. Do not wait to see if the symptoms will go away. Get medical help right away. Call your local emergency services (911 in the [place] Do not drive yourself to the hospital. Summary You can help to prevent a stroke by eating healthy, exercising, not smoking, limiting alcohol intake, and managing any medical conditions you may have. Do not use any products that contain nicotine or tobacco. These include  cigarettes, chewing tobacco, and vaping devices, such as e-cigarettes. If you need help quitting, ask your health care provider. Remember "BE FAST" for warning signs of a stroke. Get help right away if you or a loved one has any of these signs. This information is not intended to replace advice given to you by your health care provider. Make sure you discuss any questions you have  with your health care provider. Document Revised: 12/11/2019 Document Reviewed: 12/11/2019 Elsevier Patient Education  2023 [employer].

## 2021-12-22 ENCOUNTER — Other Ambulatory Visit: Payer: Self-pay

## 2021-12-22 ENCOUNTER — Telehealth: Payer: Self-pay | Admitting: Family Medicine

## 2021-12-22 ENCOUNTER — Other Ambulatory Visit: Payer: Self-pay | Admitting: Family Medicine

## 2021-12-22 MED ORDER — DAPAGLIFLOZIN PROPANEDIOL 10 MG PO TABS: 10.0000 mg | ORAL_TABLET | Freq: Every day | ORAL | 0 refills | Status: DC

## 2021-12-22 MED ORDER — SCOPOLAMINE 1 MG/3DAYS TD PT72: 1.0000 | MEDICATED_PATCH | TRANSDERMAL | 0 refills | Status: DC

## 2021-12-22 NOTE — Addendum Note (Signed)
Addended by: Margaretha Sheffield on: 12/22/2021 03:13 PM   Modules accepted: Orders

## 2021-12-22 NOTE — Telephone Encounter (Signed)
Pt requesting sea sick patches for cruise at end of August. Please advise. Thank you

## 2021-12-22 NOTE — Telephone Encounter (Signed)
Prescription sent electronically to pharmacy.Wife(DPR) advised of Provider's recommendations and verbalized understanding and stated they used them on their last cruise with no issues.

## 2021-12-22 NOTE — Telephone Encounter (Signed)
Generally scopolamine patches are tolerated well It is recommended to place them behind the ear 4 to 12 hours before the cruise leave them in place until after the cruise change them every 3 days It is not unusual when the patch is removed after the cruise to have a mild sensation of motion Most people tolerate well but if it causes urinary retention, extreme blurred vision, eyeball pain then it is recommended to stop and remove the patch ASAP May have 1 box Please verify the length of the cruise as long as it is 9 days or less 1 box should do fine If patient has a history of glaucoma we do not recommend this medication 

## 2021-12-26 ENCOUNTER — Other Ambulatory Visit: Payer: Self-pay

## 2021-12-26 DIAGNOSIS — E1140 Type 2 diabetes mellitus with diabetic neuropathy, unspecified: Secondary | ICD-10-CM

## 2021-12-26 MED ORDER — ACCU-CHEK FASTCLIX LANCETS MISC: 1 refills | Status: DC

## 2022-01-07 ENCOUNTER — Other Ambulatory Visit: Payer: Self-pay | Admitting: Family Medicine

## 2022-01-07 MED ORDER — RAMIPRIL 10 MG PO CAPS: ORAL_CAPSULE | ORAL | 0 refills | Status: DC

## 2022-01-12 ENCOUNTER — Ambulatory Visit: Payer: Federal, State, Local not specified - PPO | Admitting: Cardiology

## 2022-01-12 ENCOUNTER — Encounter: Payer: Self-pay | Admitting: *Deleted

## 2022-01-12 ENCOUNTER — Encounter: Payer: Self-pay | Admitting: Cardiology

## 2022-01-12 VITALS — BP 158/84 | HR 78 | Ht 67.5 in | Wt 152.4 lb

## 2022-01-12 DIAGNOSIS — Z8673 Personal history of transient ischemic attack (TIA), and cerebral infarction without residual deficits: Secondary | ICD-10-CM | POA: Diagnosis not present

## 2022-01-12 DIAGNOSIS — I700 Atherosclerosis of aorta: Secondary | ICD-10-CM | POA: Diagnosis not present

## 2022-01-12 DIAGNOSIS — Z8249 Family history of ischemic heart disease and other diseases of the circulatory system: Secondary | ICD-10-CM

## 2022-01-12 DIAGNOSIS — E782 Mixed hyperlipidemia: Secondary | ICD-10-CM

## 2022-01-12 DIAGNOSIS — I10 Essential (primary) hypertension: Secondary | ICD-10-CM

## 2022-01-12 NOTE — Patient Instructions (Addendum)
Medication Instructions:  Your physician recommends that you continue on your current medications as directed. Please refer to the Current Medication list given to you today.  Labwork: none  Testing/Procedures: Your physician has requested that you have en exercise stress myoview. For further information please visit www.cardiosmart.org. Please follow instruction sheet, as given.  Follow-Up: Your physician recommends that you schedule a follow-up appointment in: pending  Any Other Special Instructions Will Be Listed Below (If Applicable).  If you need a refill on your cardiac medications before your next appointment, please call your pharmacy. 

## 2022-01-12 NOTE — Progress Notes (Signed)
Cardiology Office Note  Date: 01/12/2022   ID: Victor Gonzales, DOB April 09, 1961, MRN [id number]  PCP:  Tommie Sams, DO  Cardiologist:  Nona Dell, MD Electrophysiologist:  None   Chief Complaint  Patient presents with   Evaluation of atherosclerosis    History of Present Illness: Victor Gonzales is a 61 y.o. male referred for cardiology consultation by Dr. Adriana Simas for discussion of stress testing.  I reviewed his chart and recent history including acute left basal ganglia perforator infarct in May.  Imaging studies are noted below, he did not have any atrial fibrillation documented.  He is here today with his wife.  States that his initial stroke symptoms have completely resolved.  He took a course of aspirin and Plavix, now just on aspirin alone as well as Crestor with follow-up LDL down from 126-34.  He did go back to work after his stroke, but has since retired.  He does not report any definite angina, NYHA class II dyspnea with typical activities.  ECG from May showed sinus rhythm.  He does have atherosclerosis evident by CT imaging with baseline medical history noted below.  He has not undergone any prior ischemic testing.   Past Medical History:  Diagnosis Date   Anxiety    Essential hypertension    GERD (gastroesophageal reflux disease)    Hepatic steatosis    History of stroke    May 2023   Hyperlipidemia    Type 2 diabetes mellitus (HCC)     Past Surgical History:  Procedure Laterality Date   COLONOSCOPY N/A 09/15/2013   Procedure: COLONOSCOPY;  Surgeon: West Bali, MD;  Location: [facility];  Service: Endoscopy;  Laterality: N/A;    Current Outpatient Medications  Medication Sig Dispense Refill   Accu-Chek FastClix Lancets MISC Use to check blood sugar once a day Dx: E11.9 100 each 1   amLODipine (NORVASC) 10 MG tablet Take 1 tablet (10 mg total) by mouth daily. 90 tablet 3   aspirin 81 MG tablet Take 1 tablet (81 mg total) by mouth daily. Takes one  every other day (Patient taking differently: Take 81 mg by mouth daily.) 30 tablet 3   bisoprolol-hydrochlorothiazide (ZIAC) 10-6.25 MG tablet Take 1 tablet by mouth daily. 90 tablet 0   dapagliflozin propanediol (FARXIGA) 10 MG TABS tablet Take 1 tablet (10 mg total) by mouth daily. 90 tablet 0   glimepiride (AMARYL) 4 MG tablet TAKE 2 TABLETS(8 MG) BY MOUTH DAILY 180 tablet 0   Lancets Ultra Thin 30G MISC Use once a day dx: E11.9 30 each 1   MAGNESIUM PO Take by mouth daily.     metFORMIN (GLUCOPHAGE) 500 MG tablet Take 2 tablets (1,000 mg total) by mouth 2 (two) times daily. 360 tablet 0   MISC NATURAL PRODUCTS PO Take 1 capsule by mouth daily. Liver Well     Multiple Vitamin (MULTIVITAMIN) tablet Take 1 tablet by mouth daily.     ramipril (ALTACE) 10 MG capsule TAKE 2 CAPSULES(20 MG) BY MOUTH DAILY Strength: 10 mg 180 capsule 0   rosuvastatin (CRESTOR) 10 MG tablet Take 1 tablet (10 mg total) by mouth daily. 90 tablet 3   scopolamine (TRANSDERM-SCOP) 1 MG/3DAYS Place 1 patch (1.5 mg total) onto the skin every 3 (three) days. 4 patch 0   No current facility-administered medications for this visit.   Allergies:  Codeine   Social History: The patient  reports that he has never smoked. He has never used  smokeless tobacco. He reports that he does not drink alcohol and does not use drugs.   Family History: The patient's family history includes Cancer - Ovarian in his sister; Diabetes in his mother; Heart disease in his brother and mother; Lung disease in his father.   ROS: No palpitations or dizziness.  Physical Exam: VS:  BP (!) 158/84   Pulse 78   Ht 5' 7.5" (1.715 m)   Wt 152 lb 6.4 oz (69.1 kg)   SpO2 96%   BMI 23.52 kg/m , BMI Body mass index is 23.52 kg/m.  Wt Readings from Last 3 Encounters:  01/12/22 152 lb 6.4 oz (69.1 kg)  12/16/21 152 lb (68.9 kg)  11/24/21 154 lb 6.4 oz (70 kg)    General: Patient appears comfortable at rest. HEENT: Conjunctiva and lids normal. Neck:  Supple, no elevated JVP or carotid bruits, no thyromegaly. Lungs: Clear to auscultation, nonlabored breathing at rest. Cardiac: Regular rate and rhythm, no S3 or significant systolic murmur, no pericardial rub. Abdomen: Soft, nontender, bowel sounds present. Extremities: No pitting edema, distal pulses 2+. Skin: Warm and dry. Musculoskeletal: No kyphosis. Neuropsychiatric: Alert and oriented x3, affect grossly appropriate.  ECG:  An ECG dated 10/05/2021 was personally reviewed today and demonstrated:  Sinus rhythm.  Recent Labwork: 10/05/2021: ALT 39; AST 39; BUN 15; Creatinine, Ser 0.72; Hemoglobin 16.8; Platelets 161; Potassium 3.5; Sodium 138     Component Value Date/Time   CHOL 90 10/06/2021 0433   CHOL 200 (H) 07/14/2021 0806   TRIG 77 10/06/2021 0433   HDL 41 10/06/2021 0433   HDL 52 07/14/2021 0806   CHOLHDL 2.2 10/06/2021 0433   VLDL 15 10/06/2021 0433   LDLCALC 34 10/06/2021 0433   LDLCALC 126 (H) 07/14/2021 0806    Other Studies Reviewed Today:  Head and neck CTA 10/05/2021: IMPRESSION: 1. Mild atherosclerosis in the head and neck without a large vessel occlusion. 2. Mild right M1 and right A1 stenoses. 3. Widely patent carotid and vertebral arteries. 4. Aortic Atherosclerosis (ICD10-I70.0).  Brain MRI 10/06/2021: IMPRESSION: Acute left basal ganglia perforator infarct.  Echocardiogram 10/06/2021:  1. Left ventricular ejection fraction, by estimation, is 60 to 65%. The  left ventricle has normal function. The left ventricle has no regional  wall motion abnormalities. Left ventricular diastolic parameters were  normal.   2. Right ventricular systolic function is normal. The right ventricular  size is normal.   3. The mitral valve is normal in structure. No evidence of mitral valve  regurgitation. No evidence of mitral stenosis.   4. The aortic valve is tricuspid. Aortic valve regurgitation is not  visualized. No aortic stenosis is present.   5. The inferior vena  cava is normal in size with greater than 50%  respiratory variability, suggesting right atrial pressure of 3 mmHg.   Assessment and Plan:  1.  CT evidence of atherosclerosis as well as family history of CAD in a 61 year old male with type 2 diabetes mellitus, hypertension, mixed hyperlipidemia, and recent stroke in May of this year.  He has improved symptomatically since that time.  LVEF 60 to 65% by echocardiogram in May.  No documentation of atrial fibrillation.  Would continue antiplatelet therapy and statin, we will also obtain an exercise Myoview (hold Ziac morning of procedure) for ischemic screening.  2.  Mixed hyperlipidemia, significant improvement in LDL on Crestor, most recently 34 down from 126.  3.  Essential hypertension, home systolics have been in the 120s to 130s on current regimen.  Blood pressure higher in the office today.  Continue current medications and follow-up with PCP in case further adjustments are needed.  Medication Adjustments/Labs and Tests Ordered: Current medicines are reviewed at length with the patient today.  Concerns regarding medicines are outlined above.   Tests Ordered: Orders Placed This Encounter  Procedures   NM Myocar Multi W/Spect W/Wall Motion / EF    Medication Changes: No orders of the defined types were placed in this encounter.   Disposition:  Follow up  test results.  Signed, Jonelle Sidle, MD, [facility] 01/12/2022 1:56 PM    [facility] at [facility] [address] [place], [place], [place] [address] Phone: [phone]; Fax: [phone]

## 2022-01-20 ENCOUNTER — Other Ambulatory Visit: Payer: Self-pay | Admitting: *Deleted

## 2022-01-20 MED ORDER — BISOPROLOL-HYDROCHLOROTHIAZIDE 10-6.25 MG PO TABS: 1.0000 | ORAL_TABLET | Freq: Every day | ORAL | 0 refills | Status: DC

## 2022-01-27 ENCOUNTER — Ambulatory Visit: Payer: Federal, State, Local not specified - PPO | Admitting: Family Medicine

## 2022-02-04 ENCOUNTER — Encounter (HOSPITAL_COMMUNITY): Payer: Federal, State, Local not specified - PPO

## 2022-02-05 ENCOUNTER — Encounter: Payer: Self-pay | Admitting: Family Medicine

## 2022-02-09 ENCOUNTER — Encounter: Payer: Self-pay | Admitting: Family Medicine

## 2022-02-10 ENCOUNTER — Ambulatory Visit (INDEPENDENT_AMBULATORY_CARE_PROVIDER_SITE_OTHER): Payer: Federal, State, Local not specified - PPO | Admitting: Family Medicine

## 2022-02-10 VITALS — BP 122/82 | Ht 67.5 in | Wt 147.6 lb

## 2022-02-10 DIAGNOSIS — J0100 Acute maxillary sinusitis, unspecified: Secondary | ICD-10-CM

## 2022-02-10 DIAGNOSIS — E1159 Type 2 diabetes mellitus with other circulatory complications: Secondary | ICD-10-CM

## 2022-02-10 DIAGNOSIS — I10 Essential (primary) hypertension: Secondary | ICD-10-CM

## 2022-02-10 MED ORDER — AMOXICILLIN-POT CLAVULANATE 875-125 MG PO TABS: 1.0000 | ORAL_TABLET | Freq: Two times a day (BID) | ORAL | 0 refills | Status: DC

## 2022-02-10 NOTE — Patient Instructions (Signed)
Antibiotic as prescribed.  Lab today.  Follow up in 3 months.  Take care  Dr. Lacinda Axon

## 2022-02-11 DIAGNOSIS — J320 Chronic maxillary sinusitis: Secondary | ICD-10-CM | POA: Insufficient documentation

## 2022-02-11 DIAGNOSIS — E113293 Type 2 diabetes mellitus with mild nonproliferative diabetic retinopathy without macular edema, bilateral: Secondary | ICD-10-CM | POA: Diagnosis not present

## 2022-02-11 DIAGNOSIS — I700 Atherosclerosis of aorta: Secondary | ICD-10-CM | POA: Insufficient documentation

## 2022-02-11 LAB — HEMOGLOBIN A1C
Est. average glucose Bld gHb Est-mCnc: 171 mg/dL
Hgb A1c MFr Bld: 7.6 % — ABNORMAL HIGH (ref 4.8–5.6)

## 2022-02-11 LAB — MICROALBUMIN / CREATININE URINE RATIO
Creatinine, Urine: 123.7 mg/dL
Microalb/Creat Ratio: 124 mg/g creat — ABNORMAL HIGH (ref 0–29)
Microalbumin, Urine: 153 ug/mL

## 2022-02-11 NOTE — Assessment & Plan Note (Signed)
Stable.  Continue current medications.

## 2022-02-11 NOTE — Assessment & Plan Note (Signed)
Treating with Augmentin. 

## 2022-02-11 NOTE — Progress Notes (Signed)
Subjective:  Patient ID: Victor Gonzales, male    DOB: 23-Apr-1961  Age: 61 y.o. MRN: [id number]  CC: Chief Complaint  Patient presents with   Hypertension    Follow up Sinus symptoms Seeing eye doctor tomorrow    HPI: 61 year old male with atherosclerosis, hypertension, type 2 diabetes, fatty liver, hyperlipidemia presents for follow-up.  Patient's hypertension is well controlled currently.  He is on ramipril and bisoprolol/HCTZ as well as amlodipine.  Patient concerned about weight loss.  At his last office visit he was 154 pounds.  Currently 147 pounds.  Possibly related to diabetes.  Needs A1c.  Currently on metformin, Farxiga, and glimepiride.  Reports that he is not feeling well.  Reports sinus pain, pressure, congestion.  Associated dental pain.  He is concerned that he has a sinus infection.  He has had symptoms over the past few days.  No fever.  Patient Active Problem List   Diagnosis Date Noted   Atherosclerosis of aorta ([place]) 02/11/2022   Maxillary sinusitis 02/11/2022   Type 2 diabetes mellitus with other circulatory complications ([place]) 35/00/9381   ASCVD (arteriosclerotic cardiovascular disease) 11/26/2021   CVA (cerebral vascular accident) ([place]) 10/05/2021   Hyperlipidemia 07/21/2021   Fatty liver 03/25/2020   Insomnia 04/26/2013   Generalized anxiety disorder 01/26/2013   HTN (hypertension) 10/26/2012    Social Hx   Social History   Socioeconomic History   Marital status: Married    Spouse name: Not on file   Number of children: Not on file   Years of education: Not on file   Highest education level: Not on file  Occupational History   Not on file  Tobacco Use   Smoking status: Never   Smokeless tobacco: Never  Substance and Sexual Activity   Alcohol use: No   Drug use: No   Sexual activity: Not on file  Other Topics Concern   Not on file  Social History Narrative   Not on file   Social Determinants of Health   Financial Resource Strain: Not  on file  Food Insecurity: Not on file  Transportation Needs: Not on file  Physical Activity: Not on file  Stress: Not on file  Social Connections: Not on file    Review of Systems Per HPI  Objective:  BP 122/82   Ht 5' 7.5" (1.715 m)   Wt 147 lb 9.6 oz (67 kg)   BMI 22.78 kg/m      02/10/2022    3:29 PM 01/12/2022    1:19 PM 12/16/2021    9:00 AM  BP/Weight  Systolic BP 829 937 169  Diastolic BP 82 84 82  Wt. (Lbs) 147.6 152.4 152  BMI 22.78 kg/m2 23.52 kg/m2 23.46 kg/m2    Physical Exam Vitals and nursing note reviewed.  Constitutional:      General: He is not in acute distress.    Appearance: Normal appearance.  HENT:     Head: Normocephalic and atraumatic.     Nose: Congestion present.     Mouth/Throat:     Pharynx: Oropharynx is clear.  Cardiovascular:     Rate and Rhythm: Normal rate and regular rhythm.  Pulmonary:     Effort: Pulmonary effort is normal.     Breath sounds: Normal breath sounds. No wheezing or rales.  Neurological:     Mental Status: He is alert.    Lab Results  Component Value Date   WBC 7.3 10/05/2021   HGB 16.8 10/05/2021   HCT  50.5 10/05/2021   PLT 161 10/05/2021   GLUCOSE 86 10/05/2021   CHOL 90 10/06/2021   TRIG 77 10/06/2021   HDL 41 10/06/2021   LDLCALC 34 10/06/2021   ALT 39 10/05/2021   AST 39 10/05/2021   NA 138 10/05/2021   K 3.5 10/05/2021   CL 107 10/05/2021   CREATININE 0.72 10/05/2021   BUN 15 10/05/2021   CO2 24 10/05/2021   PSA 0.31 05/31/2014   INR 1.0 10/05/2021   HGBA1C 7.6 (H) 02/10/2022   MICROALBUR 2.60 (H) 04/29/2013     Assessment & Plan:   Problem List Items Addressed This Visit       Cardiovascular and Mediastinum   HTN (hypertension) - Primary    Stable. Continue current medications.      Type 2 diabetes mellitus with other circulatory complications (HCC)    Suspected worsening control. Awaiting A1C. Continuing current medications for now. May need augmentation or additional therapy.        Relevant Orders   Hemoglobin A1c (Completed)   Microalbumin / creatinine urine ratio (Completed)     Respiratory   Maxillary sinusitis    Treating with Augmentin.      Relevant Medications   amoxicillin-clavulanate (AUGMENTIN) 875-125 MG tablet    Meds ordered this encounter  Medications   DISCONTD: amoxicillin-clavulanate (AUGMENTIN) 875-125 MG tablet    Sig: Take 1 tablet by mouth 2 (two) times daily.    Dispense:  20 tablet    Refill:  0   amoxicillin-clavulanate (AUGMENTIN) 875-125 MG tablet    Sig: Take 1 tablet by mouth 2 (two) times daily.    Dispense:  20 tablet    Refill:  0    Follow-up:  3 months  [place] DO [place]

## 2022-02-11 NOTE — Assessment & Plan Note (Signed)
Suspected worsening control. Awaiting A1C. Continuing current medications for now. May need augmentation or additional therapy.

## 2022-02-12 ENCOUNTER — Telehealth: Payer: Self-pay | Admitting: Family Medicine

## 2022-02-12 NOTE — Telephone Encounter (Signed)
Dr Lacinda Axon I don't know if your nurse called in the wrong thing for [facility] or [facility] messed up. I went up there and [facility] said that your office called the antibiotic in again instead of the new meds. Not sure what happened.

## 2022-02-17 ENCOUNTER — Other Ambulatory Visit: Payer: Self-pay

## 2022-02-17 MED ORDER — DAPAGLIFLOZIN PROPANEDIOL 10 MG PO TABS: 10.0000 mg | ORAL_TABLET | Freq: Every day | ORAL | 0 refills | Status: DC

## 2022-02-17 NOTE — Telephone Encounter (Signed)
Patient calling back following up most recent lab results and drs recommendations for elevated micro albumin. He states is out of farxiga and  will this medication be changing to a different one. Please advise

## 2022-02-18 ENCOUNTER — Telehealth: Payer: Self-pay

## 2022-02-18 NOTE — Telephone Encounter (Signed)
Per lab note from 01/2022 patient wife would like to have the ace inhibitor the note is referring to for patient's kidneys, please advise.

## 2022-02-18 NOTE — Telephone Encounter (Signed)
Patient has been made aware per drs notes and recommendations , he verbalizes understanding.

## 2022-02-20 NOTE — Telephone Encounter (Signed)
Patient made aware per drs notes. 

## 2022-02-23 ENCOUNTER — Encounter: Payer: Self-pay | Admitting: Family Medicine

## 2022-04-16 ENCOUNTER — Other Ambulatory Visit: Payer: Self-pay | Admitting: Family Medicine

## 2022-04-18 ENCOUNTER — Other Ambulatory Visit: Payer: Self-pay | Admitting: Family Medicine

## 2022-04-22 MED ORDER — RAMIPRIL 10 MG PO CAPS: ORAL_CAPSULE | ORAL | 0 refills | Status: DC

## 2022-05-12 ENCOUNTER — Ambulatory Visit: Payer: Federal, State, Local not specified - PPO | Admitting: Family Medicine

## 2022-05-12 VITALS — BP 138/78 | HR 72 | Temp 97.9°F | Wt 155.2 lb

## 2022-05-12 DIAGNOSIS — E785 Hyperlipidemia, unspecified: Secondary | ICD-10-CM

## 2022-05-12 DIAGNOSIS — E1159 Type 2 diabetes mellitus with other circulatory complications: Secondary | ICD-10-CM | POA: Diagnosis not present

## 2022-05-12 DIAGNOSIS — R809 Proteinuria, unspecified: Secondary | ICD-10-CM | POA: Diagnosis not present

## 2022-05-12 DIAGNOSIS — I2510 Atherosclerotic heart disease of native coronary artery without angina pectoris: Secondary | ICD-10-CM | POA: Diagnosis not present

## 2022-05-12 MED ORDER — METFORMIN HCL 500 MG PO TABS: ORAL_TABLET | ORAL | 3 refills | Status: DC

## 2022-05-12 MED ORDER — GLIMEPIRIDE 4 MG PO TABS: 4.0000 mg | ORAL_TABLET | Freq: Every day | ORAL | 3 refills | Status: DC

## 2022-05-12 MED ORDER — DAPAGLIFLOZIN PROPANEDIOL 10 MG PO TABS: 10.0000 mg | ORAL_TABLET | Freq: Every day | ORAL | 3 refills | Status: DC

## 2022-05-12 MED ORDER — ROSUVASTATIN CALCIUM 10 MG PO TABS: 10.0000 mg | ORAL_TABLET | Freq: Every day | ORAL | 3 refills | Status: DC

## 2022-05-12 MED ORDER — BISOPROLOL-HYDROCHLOROTHIAZIDE 10-6.25 MG PO TABS: 1.0000 | ORAL_TABLET | Freq: Every day | ORAL | 3 refills | Status: DC

## 2022-05-12 MED ORDER — RAMIPRIL 10 MG PO CAPS: ORAL_CAPSULE | ORAL | 3 refills | Status: DC

## 2022-05-12 NOTE — Assessment & Plan Note (Signed)
Stable on Crestor.  Continue. 

## 2022-05-12 NOTE — Assessment & Plan Note (Signed)
Repeating A1c to ensure good control.  Continue current medications.  No hypoglycemia.

## 2022-05-12 NOTE — Patient Instructions (Signed)
You're doing well.  Labs and Urine today.  Follow up in 6 months.

## 2022-05-12 NOTE — Assessment & Plan Note (Signed)
Reevaluating today.  Continue ramipril and Farxiga.

## 2022-05-12 NOTE — Assessment & Plan Note (Signed)
Continue aspirin, statin, and blood pressure medications.

## 2022-05-12 NOTE — Progress Notes (Signed)
Subjective:  Patient ID: Victor Gonzales, male    DOB: February 12, 1961  Age: 61 y.o. MRN: [id number]  CC: Chief Complaint  Patient presents with   Follow-up    medications    HPI:  61 year old male with history of CVA/ASCVD, hypertension, type 2 diabetes, fatty liver disease, hyperlipidemia presents for follow-up.  Patient states that he is feeling well.  No chest pain or shortness of breath.  Patient states that his fasting blood sugars have been around 120-121.  Last hemoglobin A1c was 7.6.  Needs A1c today.  Patient was also spilling protein in his last visit with a microalbumin to creatinine ratio of 124.  He is currently on metformin, glimepiride, and [place].   Hypertension is stable on amlodipine, ramipril, bisoprolol and HCTZ.  Patient believes he is in need of refills on his medication.  Hyperlipidemia has been at goal on Crestor.  Patient has no other complaints or concerns at this time.  Patient Active Problem List   Diagnosis Date Noted   Proteinuria 05/12/2022   Atherosclerosis of aorta ([place]) 02/11/2022   Type 2 diabetes mellitus with other circulatory complications ([place]) 63/89/3734   ASCVD (arteriosclerotic cardiovascular disease) 11/26/2021   CVA (cerebral vascular accident) ([place]) 10/05/2021   Hyperlipidemia 07/21/2021   Fatty liver 03/25/2020   HTN (hypertension) 10/26/2012    Social Hx   Social History   Socioeconomic History   Marital status: Married    Spouse name: Not on file   Number of children: Not on file   Years of education: Not on file   Highest education level: Not on file  Occupational History   Not on file  Tobacco Use   Smoking status: Never   Smokeless tobacco: Never  Substance and Sexual Activity   Alcohol use: No   Drug use: No   Sexual activity: Not on file  Other Topics Concern   Not on file  Social History Narrative   Not on file   Social Determinants of Health   Financial Resource Strain: Not on file  Food Insecurity: Not  on file  Transportation Needs: Not on file  Physical Activity: Not on file  Stress: Not on file  Social Connections: Not on file    Review of Systems Per HPI  Objective:  BP 138/78   Pulse 72   Temp 97.9 F (36.6 C) (Oral)   Wt 155 lb 3.2 oz (70.4 kg)   SpO2 95%   BMI 23.95 kg/m      05/12/2022    1:10 PM 02/10/2022    3:29 PM 01/12/2022    1:19 PM  BP/Weight  Systolic BP 287 681 157  Diastolic BP 78 82 84  Wt. (Lbs) 155.2 147.6 152.4  BMI 23.95 kg/m2 22.78 kg/m2 23.52 kg/m2    Physical Exam Constitutional:      General: He is not in acute distress.    Appearance: Normal appearance.  HENT:     Head: Normocephalic and atraumatic.  Cardiovascular:     Rate and Rhythm: Normal rate and regular rhythm.  Pulmonary:     Effort: Pulmonary effort is normal.     Breath sounds: Normal breath sounds. No wheezing, rhonchi or rales.  Neurological:     Mental Status: He is alert.  Psychiatric:        Mood and Affect: Mood normal.        Behavior: Behavior normal.     Lab Results  Component Value Date   WBC 7.3 10/05/2021  HGB 16.8 10/05/2021   HCT 50.5 10/05/2021   PLT 161 10/05/2021   GLUCOSE 86 10/05/2021   CHOL 90 10/06/2021   TRIG 77 10/06/2021   HDL 41 10/06/2021   LDLCALC 34 10/06/2021   ALT 39 10/05/2021   AST 39 10/05/2021   NA 138 10/05/2021   K 3.5 10/05/2021   CL 107 10/05/2021   CREATININE 0.72 10/05/2021   BUN 15 10/05/2021   CO2 24 10/05/2021   PSA 0.31 05/31/2014   INR 1.0 10/05/2021   HGBA1C 7.6 (H) 02/10/2022   MICROALBUR 2.60 (H) 04/29/2013     Assessment & Plan:   Problem List Items Addressed This Visit       Cardiovascular and Mediastinum   ASCVD (arteriosclerotic cardiovascular disease)    Continue aspirin, statin, and blood pressure medications.      Relevant Medications   bisoprolol-hydrochlorothiazide (ZIAC) 10-6.25 MG tablet   ramipril (ALTACE) 10 MG capsule   rosuvastatin (CRESTOR) 10 MG tablet   Type 2 diabetes  mellitus with other circulatory complications (HCC) - Primary    Repeating A1c to ensure good control.  Continue current medications.  No hypoglycemia.      Relevant Medications   bisoprolol-hydrochlorothiazide (ZIAC) 10-6.25 MG tablet   dapagliflozin propanediol (FARXIGA) 10 MG TABS tablet   glimepiride (AMARYL) 4 MG tablet   metFORMIN (GLUCOPHAGE) 500 MG tablet   ramipril (ALTACE) 10 MG capsule   rosuvastatin (CRESTOR) 10 MG tablet   Other Relevant Orders   CMP14+EGFR   Hemoglobin A1c   Microalbumin / creatinine urine ratio     Other   Hyperlipidemia    Stable on Crestor.  Continue.      Relevant Medications   bisoprolol-hydrochlorothiazide (ZIAC) 10-6.25 MG tablet   ramipril (ALTACE) 10 MG capsule   rosuvastatin (CRESTOR) 10 MG tablet   Proteinuria    Reevaluating today.  Continue ramipril and Farxiga.       Meds ordered this encounter  Medications   bisoprolol-hydrochlorothiazide (ZIAC) 10-6.25 MG tablet    Sig: Take 1 tablet by mouth daily.    Dispense:  90 tablet    Refill:  3   dapagliflozin propanediol (FARXIGA) 10 MG TABS tablet    Sig: Take 1 tablet (10 mg total) by mouth daily.    Dispense:  90 tablet    Refill:  3   glimepiride (AMARYL) 4 MG tablet    Sig: Take 1 tablet (4 mg total) by mouth daily with breakfast.    Dispense:  90 tablet    Refill:  3   metFORMIN (GLUCOPHAGE) 500 MG tablet    Sig: TAKE 2 TABLETS(1000 MG) BY MOUTH TWICE DAILY    Dispense:  360 tablet    Refill:  3   ramipril (ALTACE) 10 MG capsule    Sig: TAKE 2 CAPSULES(20 MG) BY MOUTH DAILY Strength: 10 mg    Dispense:  180 capsule    Refill:  3   rosuvastatin (CRESTOR) 10 MG tablet    Sig: Take 1 tablet (10 mg total) by mouth daily.    Dispense:  90 tablet    Refill:  3    Follow-up:  Return in about 6 months (around 11/11/2022).  [place]

## 2022-05-13 LAB — CMP14+EGFR
ALT: 43 IU/L (ref 0–44)
AST: 34 IU/L (ref 0–40)
Albumin/Globulin Ratio: 2.2 (ref 1.2–2.2)
Albumin: 5 g/dL — ABNORMAL HIGH (ref 3.9–4.9)
Alkaline Phosphatase: 57 IU/L (ref 44–121)
BUN/Creatinine Ratio: 14 (ref 10–24)
BUN: 12 mg/dL (ref 8–27)
Bilirubin Total: 0.9 mg/dL (ref 0.0–1.2)
CO2: 25 mmol/L (ref 20–29)
Calcium: 9.9 mg/dL (ref 8.6–10.2)
Chloride: 100 mmol/L (ref 96–106)
Creatinine, Ser: 0.87 mg/dL (ref 0.76–1.27)
Globulin, Total: 2.3 g/dL (ref 1.5–4.5)
Glucose: 184 mg/dL — ABNORMAL HIGH (ref 70–99)
Potassium: 4.4 mmol/L (ref 3.5–5.2)
Sodium: 143 mmol/L (ref 134–144)
Total Protein: 7.3 g/dL (ref 6.0–8.5)
eGFR: 98 mL/min/{1.73_m2} (ref >59)

## 2022-05-13 LAB — HEMOGLOBIN A1C
Est. average glucose Bld gHb Est-mCnc: 157 mg/dL
Hgb A1c MFr Bld: 7.1 % — ABNORMAL HIGH (ref 4.8–5.6)

## 2022-05-13 LAB — MICROALBUMIN / CREATININE URINE RATIO
Creatinine, Urine: 46.2 mg/dL
Microalb/Creat Ratio: 7 mg/g creat (ref 0–29)
Microalbumin, Urine: 3.1 ug/mL

## 2022-06-18 ENCOUNTER — Ambulatory Visit: Payer: Self-pay | Admitting: Neurology

## 2022-07-13 ENCOUNTER — Other Ambulatory Visit: Payer: Self-pay

## 2022-07-13 DIAGNOSIS — E1140 Type 2 diabetes mellitus with diabetic neuropathy, unspecified: Secondary | ICD-10-CM

## 2022-07-13 MED ORDER — RAMIPRIL 10 MG PO CAPS: ORAL_CAPSULE | ORAL | 0 refills | Status: DC

## 2022-07-13 MED ORDER — DAPAGLIFLOZIN PROPANEDIOL 10 MG PO TABS: 10.0000 mg | ORAL_TABLET | Freq: Every day | ORAL | 0 refills | Status: DC

## 2022-07-13 MED ORDER — AMLODIPINE BESYLATE 10 MG PO TABS: 10.0000 mg | ORAL_TABLET | Freq: Every day | ORAL | 0 refills | Status: DC

## 2022-07-13 MED ORDER — ACCU-CHEK FASTCLIX LANCETS MISC: 1 refills | Status: AC

## 2022-07-13 MED ORDER — GLIMEPIRIDE 4 MG PO TABS: 4.0000 mg | ORAL_TABLET | Freq: Every day | ORAL | 0 refills | Status: DC

## 2022-07-13 MED ORDER — METFORMIN HCL 500 MG PO TABS: ORAL_TABLET | ORAL | 0 refills | Status: DC

## 2022-07-13 MED ORDER — ROSUVASTATIN CALCIUM 10 MG PO TABS: 10.0000 mg | ORAL_TABLET | Freq: Every day | ORAL | 0 refills | Status: DC

## 2022-07-13 MED ORDER — BISOPROLOL-HYDROCHLOROTHIAZIDE 10-6.25 MG PO TABS: 1.0000 | ORAL_TABLET | Freq: Every day | ORAL | 0 refills | Status: DC

## 2022-08-02 ENCOUNTER — Other Ambulatory Visit: Payer: Self-pay | Admitting: Family Medicine

## 2022-08-03 ENCOUNTER — Encounter: Payer: Self-pay | Admitting: Family Medicine

## 2022-08-10 ENCOUNTER — Ambulatory Visit: Payer: 59 | Admitting: Neurology

## 2022-08-10 ENCOUNTER — Encounter: Payer: Self-pay | Admitting: Neurology

## 2022-08-10 VITALS — BP 159/89 | HR 66 | Ht 68.6 in | Wt 152.0 lb

## 2022-08-10 DIAGNOSIS — E782 Mixed hyperlipidemia: Secondary | ICD-10-CM

## 2022-08-10 DIAGNOSIS — G44209 Tension-type headache, unspecified, not intractable: Secondary | ICD-10-CM

## 2022-08-10 NOTE — Patient Instructions (Addendum)
I had a long d/w patient and his wife about his remote stroke, Lipidil poststroke hand weakness risk for recurrent stroke/TIAs, personally independently reviewed imaging studies and stroke evaluation results and answered questions.Continue aspirin 81 mg daily  for secondary stroke prevention and maintain strict control of hypertension with blood pressure goal below 130/90, diabetes with hemoglobin A1c goal below 6.5% and lipids with LDL cholesterol goal below 70 mg/dL. I also advised the patient to eat a healthy diet with plenty of whole grains, cereals, fruits and vegetables, exercise regularly and maintain ideal body weight .patient unfortunately still has residual weakness and stamina measures from his stroke and will be unable to return to work.  Check screening carotid ultrasound, lipid profile and hemoglobin A1c.  I also encouraged him to increase participation in daily stress relaxation activities like meditation, yoga and regular exercises to help with his tension headaches.  Followup in the future with me in 1 year or call earlier if necessary.

## 2022-08-10 NOTE — Progress Notes (Signed)
[facility] [address] [place] [address] [phone]       OFFICE FOLLOW UP VISITNOTE  [name] Date of Birth:  10-Dec-1960 Medical Record Number:  [id number]   Ref MD : Bonnielee Haff  Reason for referral : Stroke  HPI: Initial Visit 12/16/2021 : Mr. Bean is a 62 year old Caucasian male seen today for initial office consultation visit for stroke.  He is accompanied by his wife.  History is obtained from them and review of electronic medical records and opossum reviewed pertinent available imaging films in PACS.  He has past medical history of diabetes, hypertension, hyperlipidemia and anxiety.  Presented to [facility] emergency room on 10/05/2021 with sudden onset of trouble writing and using his right hand as well as some slurred speech when he woke up in the morning that day.  He was evaluated by telemetry neurologist Dr. Hortense Ramal and found to have resolving symptoms with NIH stroke scale of 0.  He was admitted for stroke work-up and MRI scan showed a small left basal ganglia lacunar infarct.  CT angiogram showed mild atherosclerotic changes with mild right M1 and A1 stenosis.  2D echo showed normal ejection fraction of 60 to 65% without cardiac source of embolism.  Telemetry monitoring did not reveal cardiac arrhythmias.  Hemoglobin A1c was 7.0 and LDL cholesterol was at goal at 34 mg percent.  Patient was on no antiplatelets prior to admission and he was started on dual antiplatelet therapy aspirin and Plavix for 3 weeks and then aspirin alone but he has stayed on both medications so far.  He does complain of bruising easily.  His symptoms resolved completely while he was in the hospital and has had no recurrent stroke or TIA symptoms.  He states his sugars are doing well and his fasting glucose usually are in the 110 range.  He is tolerating Crestor well without muscle aches and pains.  Blood pressure is under good control and today it is slightly high  at 165/82.  He is quite active and working his job as well as outdoors in his home.  He has no new complaints today.  He has no prior history of strokes, TIA, migraines.  Last few weeks has been having a minor headache which is bifrontal dull aching without any accompanying light or sound sensitivity and is not disabling not requiring medications.  Update 08/10/2022 : He returns for follow-up after initial consultation visit in July 2023.  He is accompanied by his wife.  He states he continues to have weakness and he often drops things from his hands.  He tires quite easily.  Occasionally stroke versus has some drooling from the corner of his mouth particularly when he is tired.  Patient states.  Not able to return to work since his work involves using his hands constantly.  He is thinking about applying for disability.  He also complains of headaches which occur once a week or so.  He takes Tylenol which helps.  He is tolerating aspirin well but does bruise easily.  He has had no bleeding episodes. Blood pressure is normally under good control though today was elevated at 159/89.  He states his diabetes is well-controlled her last A1c on 05/12/2022 was 7.1.  He is tolerating Crestor well without muscle aches and pains. ROS:   14 system review of systems is positive for dysarthria, and weakness, bruising, petechiae, headache all other systems negative  PMH:  Past Medical History:  Diagnosis Date  Anxiety    Essential hypertension    GERD (gastroesophageal reflux disease)    Hepatic steatosis    History of stroke    May 2023   Hyperlipidemia    Type 2 diabetes mellitus ([place])     Social History:  Social History   Socioeconomic History   Marital status: Married    Spouse name: Not on file   Number of children: Not on file   Years of education: Not on file   Highest education level: Not on file  Occupational History   Not on file  Tobacco Use   Smoking status: Never   Smokeless tobacco:  Never  Substance and Sexual Activity   Alcohol use: No   Drug use: No   Sexual activity: Not on file  Other Topics Concern   Not on file  Social History Narrative   Not on file   Social Determinants of Health   Financial Resource Strain: Not on file  Food Insecurity: Not on file  Transportation Needs: Not on file  Physical Activity: Not on file  Stress: Not on file  Social Connections: Not on file  Intimate Partner Violence: Not on file    Medications:   Current Outpatient Medications on File Prior to Visit  Medication Sig Dispense Refill   amLODipine (NORVASC) 10 MG tablet Take 1 tablet (10 mg total) by mouth daily. 90 tablet 0   aspirin 81 MG tablet Take 1 tablet (81 mg total) by mouth daily. Takes one every other day (Patient taking differently: Take 81 mg by mouth daily.) 30 tablet 3   bisoprolol-hydrochlorothiazide (ZIAC) 10-6.25 MG tablet Take 1 tablet by mouth daily. 90 tablet 0   dapagliflozin propanediol (FARXIGA) 10 MG TABS tablet Take 1 tablet (10 mg total) by mouth daily. 90 tablet 0   glimepiride (AMARYL) 4 MG tablet Take 1 tablet (4 mg total) by mouth daily with breakfast. 90 tablet 0   MAGNESIUM PO Take by mouth daily.     metFORMIN (GLUCOPHAGE) 500 MG tablet TAKE 2 TABLETS(1000 MG) BY MOUTH TWICE DAILY (Patient taking differently: No sig reported) 360 tablet 0   MISC NATURAL PRODUCTS PO Take 1 capsule by mouth daily. Liver Well     Multiple Vitamin (MULTIVITAMIN) tablet Take 1 tablet by mouth daily.     ramipril (ALTACE) 10 MG capsule TAKE 2 CAPSULES(20 MG) BY MOUTH DAILY Strength: 10 mg 180 capsule 0   rosuvastatin (CRESTOR) 10 MG tablet TAKE 1 TABLET(10 MG) BY MOUTH DAILY 90 tablet 1   Accu-Chek FastClix Lancets MISC Use to check blood sugar once a day Dx: E11.9 100 each 1   No current facility-administered medications on file prior to visit.    Allergies:   Allergies  Allergen Reactions   Codeine Other (See Comments)    Unknown    Physical  Exam General: well developed, well nourished, pleasant middle-age Caucasian male seated, in no evident distress Head: head normocephalic and atraumatic.  Neck: supple with no carotid or supraclavicular bruits Cardiovascular: regular rate and rhythm, no murmurs Musculoskeletal: no deformity Skin:  no rash scattered bilateral forearm petichiae Vascular:  Normal pulses all extremities Vitals:   08/10/22 1146  BP: (!) 159/89  Pulse: 66   Neurologic Exam Mental Status: Awake and fully alert. Oriented to place and time. Recent and remote memory intact. Attention span, concentration and fund of knowledge appropriate. Mood and affect appropriate.  Cranial Nerves: Fundoscopic exam reveals sharp disc margins. Pupils equal, briskly reactive to light. Extraocular movements  full without nystagmus. Visual fields full to confrontation. Hearing intact. Facial sensation intact. Face, tongue, palate moves normally and symmetrically.  Motor: Normal bulk and tone. Normal strength in all tested extremity muscles.  Weakness of left grip and intrinsic hand muscles.  Orbits right over left upper extremity.  Left grip is weaker than right but there is giveaway Sensory.: intact to touch ,pinprick .position and vibratory sensation.  Coordination: Rapid alternating movements normal in all extremities. Finger-to-nose and heel-to-shin performed accurately bilaterally. Gait and Station: Arises from chair without difficulty. Stance is normal. Gait demonstrates normal stride length and balance . Able to heel, toe and tandem walk without difficulty.  Reflexes: 1+ and symmetric. Toes downgoing.   NIHSS  0 Modified Rankin  1   ASSESSMENT: 62 year old Caucasian male with transient episode of dysarthria and right hand weakness in May 2023 due to left subcortical infarct from small vessel disease.  Vascular risk factors of diabetes, hypertension and hyperlipidemia.  Patient is doing well from neurovascular standpoint with near  complete recovery.  Chronic headaches likely tension headache    PLAN:I had a long d/w patient and his wife about his remote stroke, Lipidil poststroke hand weakness risk for recurrent stroke/TIAs, personally independently reviewed imaging studies and stroke evaluation results and answered questions.Continue aspirin 81 mg daily  for secondary stroke prevention and maintain strict control of hypertension with blood pressure goal below 130/90, diabetes with hemoglobin A1c goal below 6.5% and lipids with LDL cholesterol goal below 70 mg/dL. I also advised the patient to eat a healthy diet with plenty of whole grains, cereals, fruits and vegetables, exercise regularly and maintain ideal body weight .patient unfortunately still has residual weakness and stamina measures from his stroke and will be unable to return to work.  Check screening carotid ultrasound, lipid profile and hemoglobin A1c.  I also encouraged him to increase participation in daily stress relaxation activities like meditation, yoga and regular exercises to help with his tension headaches.  Followup in the future with me in 1 year or call earlier if necessary..Greater than 50% of time during this 35 minute   visit was spent on counseling,explanation of diagnosis of lacunar stroke, planning of further management, discussion with patient and family and coordination of care Antony Contras, MD Note: This document was prepared with digital dictation and possible smart phrase technology. Any transcriptional errors that result from this process are unintentional

## 2022-08-11 ENCOUNTER — Ambulatory Visit: Payer: Federal, State, Local not specified - PPO | Admitting: Family Medicine

## 2022-08-11 DIAGNOSIS — Z0271 Encounter for disability determination: Secondary | ICD-10-CM

## 2022-08-11 LAB — LIPID PANEL
Chol/HDL Ratio: 2 ratio (ref 0.0–5.0)
Cholesterol, Total: 95 mg/dL — ABNORMAL LOW (ref 100–199)
HDL: 48 mg/dL (ref >39)
LDL Chol Calc (NIH): 28 mg/dL (ref 0–99)
Triglycerides: 99 mg/dL (ref 0–149)
VLDL Cholesterol Cal: 19 mg/dL (ref 5–40)

## 2022-08-11 LAB — HEMOGLOBIN A1C
Est. average glucose Bld gHb Est-mCnc: 157 mg/dL
Hgb A1c MFr Bld: 7.1 % — ABNORMAL HIGH (ref 4.8–5.6)

## 2022-08-14 ENCOUNTER — Encounter: Payer: Self-pay | Admitting: Family Medicine

## 2022-08-17 ENCOUNTER — Telehealth: Payer: Self-pay

## 2022-08-17 NOTE — Telephone Encounter (Signed)
Pt made aware of result note and voiced understanding of all discussed.

## 2022-08-17 NOTE — Telephone Encounter (Signed)
Pt called back. Requesting a call back from nurse.  

## 2022-08-17 NOTE — Telephone Encounter (Signed)
-----   Message from Garvin Fila, MD sent at 08/14/2022  8:25 AM EDT ----- Victor Gonzales inform the patient that cholesterol profile is quite satisfactory.  Screening test for diabetes is slightly elevated and to see primary care physician to discuss management

## 2022-08-17 NOTE — Telephone Encounter (Signed)
Left msg for pt to call to obtain results

## 2022-08-28 ENCOUNTER — Ambulatory Visit (HOSPITAL_COMMUNITY): Payer: 59

## 2022-09-07 ENCOUNTER — Encounter: Payer: Self-pay | Admitting: Neurology

## 2022-09-07 ENCOUNTER — Encounter: Payer: Self-pay | Admitting: Cardiology

## 2022-09-21 ENCOUNTER — Encounter: Payer: Self-pay | Admitting: Family Medicine

## 2022-09-21 ENCOUNTER — Other Ambulatory Visit: Payer: Self-pay | Admitting: Family Medicine

## 2022-09-21 MED ORDER — METFORMIN HCL 500 MG PO TABS: ORAL_TABLET | ORAL | 1 refills | Status: DC

## 2022-09-21 MED ORDER — RAMIPRIL 10 MG PO CAPS: ORAL_CAPSULE | ORAL | 1 refills | Status: DC

## 2022-09-21 MED ORDER — BISOPROLOL-HYDROCHLOROTHIAZIDE 10-6.25 MG PO TABS: 1.0000 | ORAL_TABLET | Freq: Every day | ORAL | 1 refills | Status: DC

## 2022-09-21 MED ORDER — AMLODIPINE BESYLATE 10 MG PO TABS: 10.0000 mg | ORAL_TABLET | Freq: Every day | ORAL | 1 refills | Status: DC

## 2022-09-21 MED ORDER — GLIMEPIRIDE 4 MG PO TABS: 4.0000 mg | ORAL_TABLET | Freq: Every day | ORAL | 1 refills | Status: DC

## 2022-09-21 MED ORDER — BLOOD GLUCOSE TEST STRIPS 333 VI STRP: 1.0000 | ORAL_STRIP | Freq: Every day | 2 refills | Status: AC

## 2022-09-21 MED ORDER — DAPAGLIFLOZIN PROPANEDIOL 10 MG PO TABS: 10.0000 mg | ORAL_TABLET | Freq: Every day | ORAL | 1 refills | Status: DC

## 2022-10-02 ENCOUNTER — Other Ambulatory Visit: Payer: Self-pay | Admitting: Family Medicine

## 2022-10-06 ENCOUNTER — Encounter: Payer: Self-pay | Admitting: Cardiology

## 2022-10-06 ENCOUNTER — Ambulatory Visit: Payer: 59 | Attending: Cardiology | Admitting: Cardiology

## 2022-10-06 VITALS — BP 150/72 | HR 75 | Ht 68.5 in | Wt 156.0 lb

## 2022-10-06 DIAGNOSIS — R9431 Abnormal electrocardiogram [ECG] [EKG]: Secondary | ICD-10-CM | POA: Diagnosis not present

## 2022-10-06 DIAGNOSIS — I2510 Atherosclerotic heart disease of native coronary artery without angina pectoris: Secondary | ICD-10-CM

## 2022-10-06 DIAGNOSIS — Z8673 Personal history of transient ischemic attack (TIA), and cerebral infarction without residual deficits: Secondary | ICD-10-CM

## 2022-10-06 DIAGNOSIS — E782 Mixed hyperlipidemia: Secondary | ICD-10-CM | POA: Diagnosis not present

## 2022-10-06 DIAGNOSIS — I10 Essential (primary) hypertension: Secondary | ICD-10-CM

## 2022-10-06 NOTE — Patient Instructions (Signed)
Medication Instructions:  ?Your physician recommends that you continue on your current medications as directed. Please refer to the Current Medication list given to you today. ? ? ?Labwork: ?None today ? ?Testing/Procedures: ?Your physician has requested that you have a lexiscan myoview. For further information please visit [email or web address]. Please follow instruction sheet, as given. ? ? ?Follow-Up: ?6 months ? ?Any Other Special Instructions Will Be Listed Below (If Applicable). ? ?If you need a refill on your cardiac medications before your next appointment, please call your pharmacy. ? ?

## 2022-10-06 NOTE — Progress Notes (Signed)
Cardiology Office Note  Date: 10/06/2022   ID: FLYNT SCHRECKENGOST, DOB Nov 08, 1960, MRN [id number]  History of Present Illness: Victor Gonzales is a 62 y.o. male seen in consultation back in August 2023.  He was referred for a Myoview at that time for ischemic evaluation in light of atherosclerosis evident by CT imaging in the setting of other active cardiac risk factors.  He ultimately did not present for testing.  He is here today with his wife for follow-up.  Continues to follow with Dr. Adriana Simas, states that he is applying for disability at this time.  Indicates that he has been weak with lack of stamina following his stroke last year, has not been able to return to work.  He does not describe any obvious angina however and reports compliance with his medications.  I went over his medicines and most recent lab work.  He remains on Crestor 10 mg daily with LDL 28 in March.  He has had some adjustments in his diabetic regimen by Dr. Adriana Simas.  Still on aspirin with mild recurrent bruising but no reported major bleeding problems.  Also Norvasc, Ziac, Farxiga, and Altace.  I reviewed his ECG today which shows sinus rhythm with rightward axis, Q waves in the inferior leads with nonspecific ST changes.  He did have an echocardiogram in May of last year revealing LVEF 60 to 65% without regional motion abnormalities.  We talked again today about proceeding with stress testing to assess ischemic burden in light of documented atherosclerosis.  Physical Exam: VS:  BP (!) 150/72   Pulse 75   Ht 5' 8.5" (1.74 m)   Wt 156 lb (70.8 kg)   SpO2 96%   BMI 23.37 kg/m , BMI Body mass index is 23.37 kg/m.  Wt Readings from Last 3 Encounters:  10/06/22 156 lb (70.8 kg)  08/10/22 152 lb (68.9 kg)  05/12/22 155 lb 3.2 oz (70.4 kg)    General: Patient appears comfortable at rest. HEENT: Conjunctiva and lids normal. Neck: Supple, no elevated JVP or carotid bruits. Lungs: Clear to auscultation, nonlabored breathing at  rest. Cardiac: Regular rate and rhythm, no S3 or significant systolic murmur. Extremities: No pitting edema.  ECG:  An ECG dated 10/05/2021 was personally reviewed today and demonstrated:  Sinus rhythm.  Labwork: 05/12/2022: ALT 43; AST 34; BUN 12; Creatinine, Ser 0.87; Potassium 4.4; Sodium 143     Component Value Date/Time   CHOL 95 (L) 08/10/2022 1214   TRIG 99 08/10/2022 1214   HDL 48 08/10/2022 1214   CHOLHDL 2.0 08/10/2022 1214   CHOLHDL 2.2 10/06/2021 0433   VLDL 15 10/06/2021 0433   LDLCALC 28 08/10/2022 1214   Other Studies Reviewed Today:  No interval cardiac testing for review today.  Assessment and Plan:  1.  Atherosclerosis evident by CT imaging as reviewed in the prior note from August 2023.  Screening ischemic evaluation was recommended at that time, he did not present for testing.  LVEF 60 to 65% by echocardiogram in May 2023, no regional wall motion abnormalities.  ECG noted above.  He is on reasonable medical therapy from the perspective of atherosclerosis, still important to exclude substantial ischemia and we discussed proceeding with a [employer], he is now in agreement.  Continue aspirin, Farxiga, and Crestor.  2.  Essential hypertension.  No changes made to current regimen including Norvasc, Ziac, and Altace.  Keep follow-up with Dr. Margo Aye.  3.  Mixed hyperlipidemia, LDL 28 by lab work in  March of this year.  He remains on Crestor 10 mg daily.  4.  History of stroke in May 2023, acute left basal ganglia perforator infarct treated with antiplatelet therapy.  No atrial fibrillation documented.  Disposition:  Follow up  6 months.  Signed, Jonelle Sidle, M.D., F.A.C.C. [facility]

## 2022-10-13 ENCOUNTER — Encounter: Payer: Self-pay | Admitting: Cardiology

## 2022-10-15 ENCOUNTER — Encounter (HOSPITAL_COMMUNITY): Payer: 59

## 2022-10-28 ENCOUNTER — Encounter (HOSPITAL_BASED_OUTPATIENT_CLINIC_OR_DEPARTMENT_OTHER)
Admission: RE | Admit: 2022-10-28 | Discharge: 2022-10-28 | Disposition: A | Discharge status: Home / Self Care | Payer: 59 | Source: Ambulatory Visit | Attending: Cardiology | Admitting: Cardiology

## 2022-10-28 ENCOUNTER — Encounter (HOSPITAL_COMMUNITY): Payer: Self-pay

## 2022-10-28 ENCOUNTER — Ambulatory Visit (HOSPITAL_COMMUNITY)
Admission: RE | Admit: 2022-10-28 | Discharge: 2022-10-28 | Disposition: A | Discharge status: Home / Self Care | Payer: 59 | Source: Ambulatory Visit | Attending: Cardiology | Admitting: Cardiology

## 2022-10-28 DIAGNOSIS — R9431 Abnormal electrocardiogram [ECG] [EKG]: Secondary | ICD-10-CM

## 2022-10-28 DIAGNOSIS — I2510 Atherosclerotic heart disease of native coronary artery without angina pectoris: Secondary | ICD-10-CM | POA: Diagnosis present

## 2022-10-28 HISTORY — DX: Malignant (primary) neoplasm, unspecified: C80.1

## 2022-10-28 LAB — NM MYOCAR MULTI W/SPECT W/WALL MOTION / EF
Base ST Depression (mm): 0 mm
LV dias vol: 76 mL (ref 62–150)
LV sys vol: 29 mL
Nuc Stress EF: 62 %
Peak HR: 103 {beats}/min
RATE: 0.4
Rest HR: 62 {beats}/min
Rest Nuclear Isotope Dose: 9.1 mCi
SDS: 0
SRS: 0
SSS: 0
ST Depression (mm): 0 mm
Stress Nuclear Isotope Dose: 31 mCi
TID: 1.16

## 2022-10-28 MED ORDER — SODIUM CHLORIDE FLUSH 0.9 % IV SOLN
INTRAVENOUS | Status: AC
  Administered 2022-10-28: 10 mL via INTRAVENOUS
  Filled 2022-10-28: qty 10

## 2022-10-28 MED ORDER — TECHNETIUM TC 99M TETROFOSMIN IV KIT
30.0000 | PACK | Freq: Once | INTRAVENOUS | Status: AC | PRN
  Administered 2022-10-28: 31 via INTRAVENOUS

## 2022-10-28 MED ORDER — REGADENOSON 0.4 MG/5ML IV SOLN
INTRAVENOUS | Status: AC
  Administered 2022-10-28: 0.4 mg via INTRAVENOUS
  Filled 2022-10-28: qty 5

## 2022-10-28 MED ORDER — TECHNETIUM TC 99M TETROFOSMIN IV KIT
10.0000 | PACK | Freq: Once | INTRAVENOUS | Status: AC | PRN
  Administered 2022-10-28: 9.1 via INTRAVENOUS

## 2022-11-11 ENCOUNTER — Ambulatory Visit: Payer: 59 | Admitting: Family Medicine

## 2022-11-11 VITALS — BP 136/71 | HR 68 | Temp 97.9°F | Ht 68.5 in | Wt 149.0 lb

## 2022-11-11 DIAGNOSIS — E785 Hyperlipidemia, unspecified: Secondary | ICD-10-CM | POA: Diagnosis not present

## 2022-11-11 DIAGNOSIS — R5383 Other fatigue: Secondary | ICD-10-CM

## 2022-11-11 DIAGNOSIS — I10 Essential (primary) hypertension: Secondary | ICD-10-CM | POA: Diagnosis not present

## 2022-11-11 DIAGNOSIS — F321 Major depressive disorder, single episode, moderate: Secondary | ICD-10-CM

## 2022-11-11 DIAGNOSIS — E1159 Type 2 diabetes mellitus with other circulatory complications: Secondary | ICD-10-CM

## 2022-11-11 DIAGNOSIS — Z7984 Long term (current) use of oral hypoglycemic drugs: Secondary | ICD-10-CM

## 2022-11-11 NOTE — Patient Instructions (Signed)
Labs today.  Consider treatment for depression/anxiety.  Follow up in 6 months.

## 2022-11-12 DIAGNOSIS — F321 Major depressive disorder, single episode, moderate: Secondary | ICD-10-CM | POA: Insufficient documentation

## 2022-11-12 LAB — CMP14+EGFR
ALT: 44 IU/L (ref 0–44)
AST: 46 IU/L — ABNORMAL HIGH (ref 0–40)
Albumin: 4.9 g/dL (ref 3.9–4.9)
Alkaline Phosphatase: 69 IU/L (ref 44–121)
BUN/Creatinine Ratio: 14 (ref 10–24)
BUN: 14 mg/dL (ref 8–27)
Bilirubin Total: 0.9 mg/dL (ref 0.0–1.2)
CO2: 24 mmol/L (ref 20–29)
Calcium: 10.8 mg/dL — ABNORMAL HIGH (ref 8.6–10.2)
Chloride: 102 mmol/L (ref 96–106)
Creatinine, Ser: 0.99 mg/dL (ref 0.76–1.27)
Globulin, Total: 2.5 g/dL (ref 1.5–4.5)
Glucose: 133 mg/dL — ABNORMAL HIGH (ref 70–99)
Potassium: 5.4 mmol/L — ABNORMAL HIGH (ref 3.5–5.2)
Sodium: 142 mmol/L (ref 134–144)
Total Protein: 7.4 g/dL (ref 6.0–8.5)
eGFR: 86 mL/min/{1.73_m2} (ref >59)

## 2022-11-12 LAB — MICROALBUMIN / CREATININE URINE RATIO
Creatinine, Urine: 61.7 mg/dL
Microalb/Creat Ratio: 8 mg/g creat (ref 0–29)
Microalbumin, Urine: 5.2 ug/mL

## 2022-11-12 LAB — CBC
Hematocrit: 53.9 % — ABNORMAL HIGH (ref 37.5–51.0)
Hemoglobin: 17.2 g/dL (ref 13.0–17.7)
MCH: 27.6 pg (ref 26.6–33.0)
MCHC: 31.9 g/dL (ref 31.5–35.7)
MCV: 86 fL (ref 79–97)
Platelets: 197 10*3/uL (ref 150–450)
RBC: 6.24 x10E6/uL — ABNORMAL HIGH (ref 4.14–5.80)
RDW: 12.8 % (ref 11.6–15.4)
WBC: 8.1 10*3/uL (ref 3.4–10.8)

## 2022-11-12 LAB — HEMOGLOBIN A1C
Est. average glucose Bld gHb Est-mCnc: 174 mg/dL
Hgb A1c MFr Bld: 7.7 % — ABNORMAL HIGH (ref 4.8–5.6)

## 2022-11-12 NOTE — Assessment & Plan Note (Signed)
At goal on Crestor. Continue.  

## 2022-11-12 NOTE — Assessment & Plan Note (Signed)
Hypertension is at goal.  Continue current medications. 

## 2022-11-12 NOTE — Assessment & Plan Note (Signed)
Obtaining A1c to reassess.  Continue Farxiga, Amaryl, and metformin.  If not at goal we will need increase in Amaryl.

## 2022-11-12 NOTE — Progress Notes (Signed)
Subjective:  Patient ID: Victor Gonzales, male    DOB: January 14, 1961  Age: 62 y.o. MRN: [id number]  CC: Chief Complaint  Patient presents with   stress test results   6 month follow up    HPI:  62 year old male with past medical history of CVA, hypertension, atherosclerosis, type 2 diabetes, fatty liver, hyperlipidemia presents for follow-up.  Patient reports that he is having difficulty with depression.  He is also having trouble sleeping.  Patient is with his wife today.  States that he gives out easily and is upset that he cannot do things he used to do before his stroke. Will discuss treatment options today.  Hypertension is fairly well-controlled on Ziac, amlodipine, and ramipril.  Patient recently had a stress test which was normal.  Needs A1c checked.  He has been watching his diet more closely.  He is on metformin, Farxiga, and Amaryl.  Lipids have been below 70 on Crestor.  Patient Active Problem List   Diagnosis Date Noted   Depression, major, single episode, moderate (HCC) 11/12/2022   Proteinuria 05/12/2022   Atherosclerosis of aorta (HCC) 02/11/2022   Type 2 diabetes mellitus with other circulatory complications (HCC) 02/10/2022   ASCVD (arteriosclerotic cardiovascular disease) 11/26/2021   CVA (cerebral vascular accident) (HCC) 10/05/2021   Hyperlipidemia 07/21/2021   Fatty liver 03/25/2020   HTN (hypertension) 10/26/2012    Social Hx   Social History   Socioeconomic History   Marital status: Married    Spouse name: Not on file   Number of children: Not on file   Years of education: Not on file   Highest education level: Not on file  Occupational History   Not on file  Tobacco Use   Smoking status: Never   Smokeless tobacco: Never  Vaping Use   Vaping Use: Never used  Substance and Sexual Activity   Alcohol use: No   Drug use: No   Sexual activity: Not on file  Other Topics Concern   Not on file  Social History Narrative   Not on file   Social  Determinants of Health   Financial Resource Strain: Not on file  Food Insecurity: Not on file  Transportation Needs: Not on file  Physical Activity: Not on file  Stress: Not on file  Social Connections: Not on file    Review of Systems Per HPI  Objective:  BP 136/71   Pulse 68   Temp 97.9 F (36.6 C)   Ht 5' 8.5" (1.74 m)   Wt 149 lb (67.6 kg)   SpO2 97%   BMI 22.33 kg/m      11/11/2022    1:50 PM 11/11/2022    1:39 PM 10/06/2022    1:57 PM  BP/Weight  Systolic BP 136 151 150  Diastolic BP 71 77 72  Wt. (Lbs)  149 156  BMI  22.33 kg/m2 23.37 kg/m2    Physical Exam Vitals and nursing note reviewed.  Constitutional:      General: He is not in acute distress.    Appearance: Normal appearance.  HENT:     Head: Normocephalic and atraumatic.  Eyes:     General:        Right eye: No discharge.        Left eye: No discharge.     Conjunctiva/sclera: Conjunctivae normal.  Cardiovascular:     Rate and Rhythm: Normal rate and regular rhythm.  Pulmonary:     Effort: Pulmonary effort is normal.  Breath sounds: Normal breath sounds.  Neurological:     Mental Status: He is alert.  Psychiatric:     Comments: Anxious.    Lab Results  Component Value Date   WBC 8.1 11/11/2022   HGB 17.2 11/11/2022   HCT 53.9 (H) 11/11/2022   PLT 197 11/11/2022   GLUCOSE 133 (H) 11/11/2022   CHOL 95 (L) 08/10/2022   TRIG 99 08/10/2022   HDL 48 08/10/2022   LDLCALC 28 08/10/2022   ALT 44 11/11/2022   AST 46 (H) 11/11/2022   NA 142 11/11/2022   K 5.4 (H) 11/11/2022   CL 102 11/11/2022   CREATININE 0.99 11/11/2022   BUN 14 11/11/2022   CO2 24 11/11/2022   PSA 0.31 05/31/2014   INR 1.0 10/05/2021   HGBA1C 7.7 (H) 11/11/2022   MICROALBUR 2.60 (H) 04/29/2013     Assessment & Plan:   Problem List Items Addressed This Visit       Cardiovascular and Mediastinum   Type 2 diabetes mellitus with other circulatory complications (HCC) - Primary    Obtaining A1c to reassess.   Continue Farxiga, Amaryl, and metformin.  If not at goal we will need increase in Amaryl.      Relevant Orders   CMP14+EGFR (Completed)   Hemoglobin A1c (Completed)   Microalbumin / creatinine urine ratio (Completed)   HTN (hypertension)    Hypertension is at goal.  Continue current medications.        Other   Hyperlipidemia    At goal on Crestor.  Continue.      Depression, major, single episode, moderate (HCC)    Discussed treatment. Patient declined.      Other Visit Diagnoses     Fatigue, unspecified type       Relevant Orders   CBC (Completed)      Follow-up: 6 months  Victor Arciga Adriana Simas DO [facility]

## 2022-11-12 NOTE — Assessment & Plan Note (Signed)
Discussed treatment. Patient declined.

## 2022-11-18 ENCOUNTER — Other Ambulatory Visit: Payer: Self-pay | Admitting: Family Medicine

## 2022-11-18 DIAGNOSIS — E1159 Type 2 diabetes mellitus with other circulatory complications: Secondary | ICD-10-CM

## 2022-11-18 MED ORDER — GLIMEPIRIDE 4 MG PO TABS: 4.0000 mg | ORAL_TABLET | Freq: Two times a day (BID) | ORAL | 1 refills | Status: DC

## 2022-12-11 LAB — CMP14+EGFR
ALT: 37 IU/L (ref 0–44)
AST: 33 IU/L (ref 0–40)
Albumin: 5.1 g/dL — ABNORMAL HIGH (ref 3.9–4.9)
Alkaline Phosphatase: 68 IU/L (ref 44–121)
BUN/Creatinine Ratio: 15 (ref 10–24)
BUN: 14 mg/dL (ref 8–27)
Bilirubin Total: 1 mg/dL (ref 0.0–1.2)
CO2: 24 mmol/L (ref 20–29)
Calcium: 9.7 mg/dL (ref 8.6–10.2)
Chloride: 100 mmol/L (ref 96–106)
Creatinine, Ser: 0.91 mg/dL (ref 0.76–1.27)
Globulin, Total: 2.6 g/dL (ref 1.5–4.5)
Glucose: 179 mg/dL — ABNORMAL HIGH (ref 70–99)
Potassium: 4.3 mmol/L (ref 3.5–5.2)
Sodium: 142 mmol/L (ref 134–144)
Total Protein: 7.7 g/dL (ref 6.0–8.5)
eGFR: 95 mL/min/{1.73_m2} (ref >59)

## 2022-12-30 NOTE — Progress Notes (Signed)
This encounter was created in error - please disregard.

## 2023-01-19 ENCOUNTER — Other Ambulatory Visit: Payer: Self-pay | Admitting: Family Medicine

## 2023-02-17 LAB — HM DIABETES EYE EXAM

## 2023-02-19 ENCOUNTER — Encounter: Payer: Self-pay | Admitting: Family Medicine

## 2023-02-23 IMAGING — MR MR HEAD W/O CM
10 of 11 series · 40 of 48 positions shown · non-contrast
Comparison: CT head October 05, 21.

CLINICAL DATA: Stroke, follow up

EXAM:
MRI HEAD WITHOUT CONTRAST
TECHNIQUE: Multiplanar, multiecho pulse sequences of the brain and surrounding
structures were obtained without intravenous contrast.

[Series 5: DWI · axial · 4.0mm · 0.88mm/px · z∈[-52,+87]mm · 4 of 36 slices shown (1 of 6)]
[im 1/36]
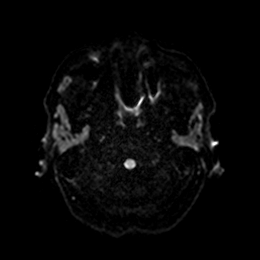
[im 12/36]
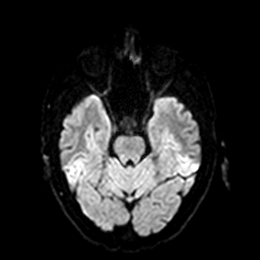
[im 24/36]
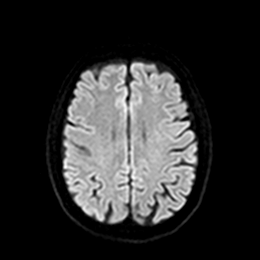
[im 36/36]
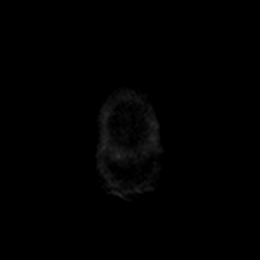

[Series 5: DWI · axial · 4.0mm · 0.88mm/px · z∈[-52,+87]mm · 5 of 36 slices shown (2 of 6)]
[im 1/36]
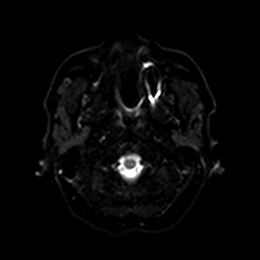
[im 9/36]
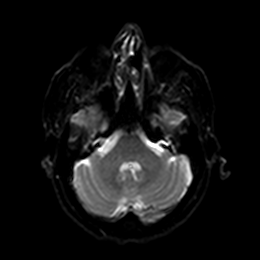
[im 18/36]
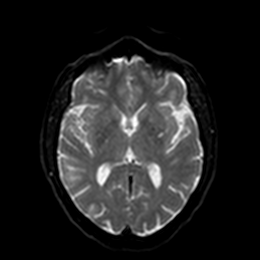
[im 27/36]
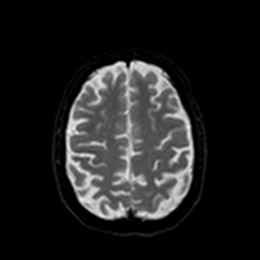
[im 36/36]
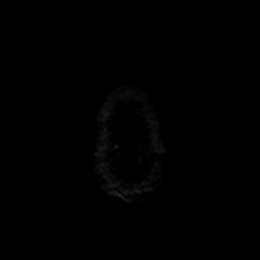

[Series 6: DWI · axial · 4.0mm · 0.88mm/px · z∈[-52,+87]mm · 5 of 36 slices shown (3 of 6)]
[im 1/36]
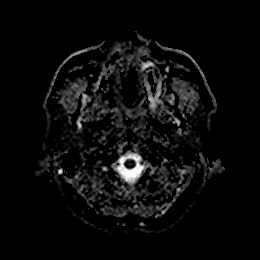
[im 9/36]
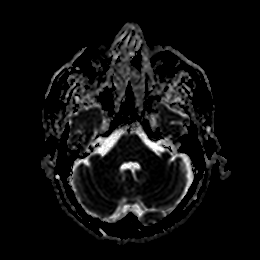
[im 18/36]
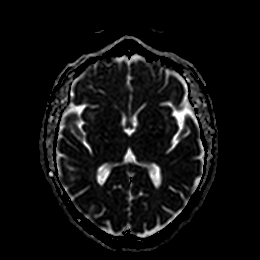
[im 27/36]
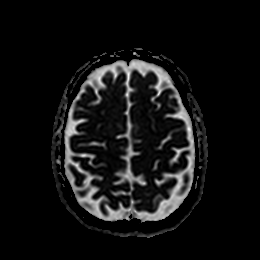
[im 36/36]
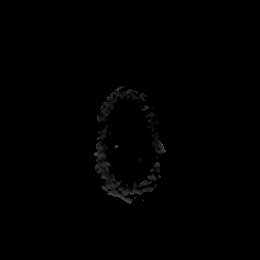

[Series 7: DWI · coronal · 5.0mm · 0.88mm/px · 4 of 28 slices shown (4 of 6)]
[im 1/28]
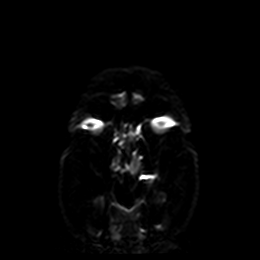
[im 10/28]
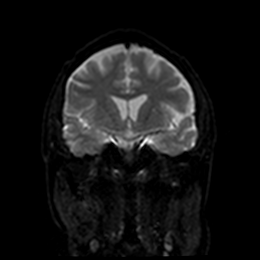
[im 19/28]
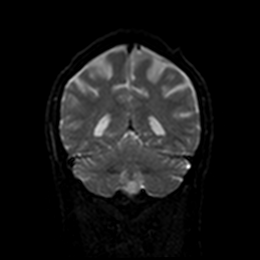
[im 28/28]
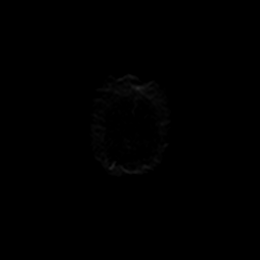

[Series 7: DWI · coronal · 5.0mm · 0.88mm/px · 4 of 28 slices shown (5 of 6)]
[im 1/28]
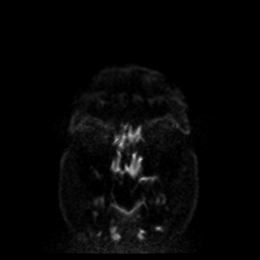
[im 10/28]
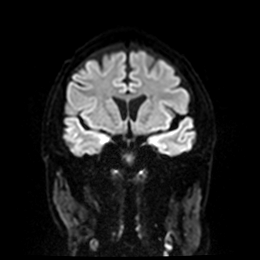
[im 19/28]
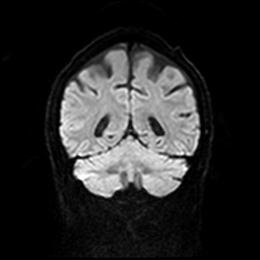
[im 28/28]
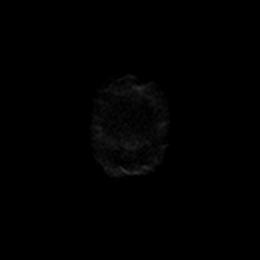

[Series 8: DWI · coronal · 5.0mm · 0.88mm/px · 4 of 28 slices shown (6 of 6)]
[im 1/28]
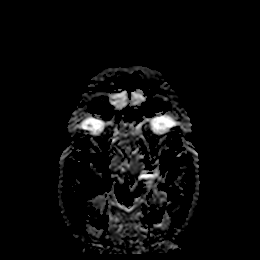
[im 10/28]
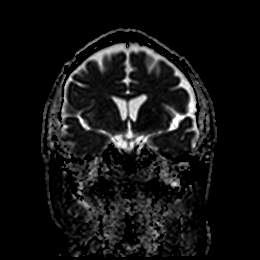
[im 19/28]
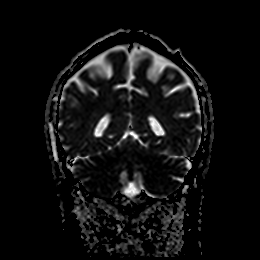
[im 28/28]
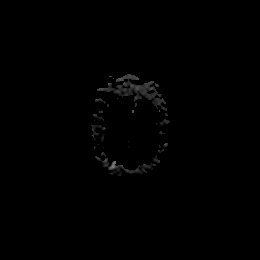

[Series 9: T1 · sagittal · 5.0mm · 0.94mm/px · 3 of 21 slices shown]
[im 1/21]
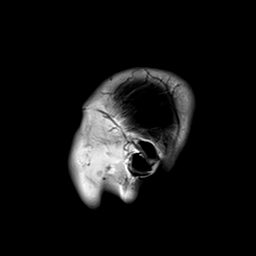
[im 11/21]
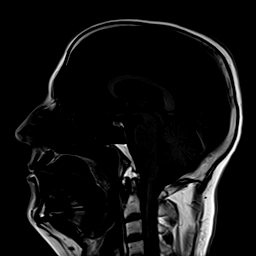
[im 21/21]
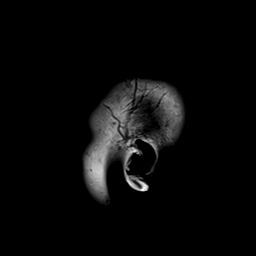

[Series 15: T2 · axial · 5.0mm · 0.72mm/px · z∈[-49,+84]mm · 3 of 20 slices shown]
[im 1/20]
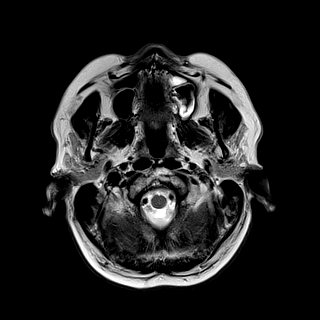
[im 10/20]
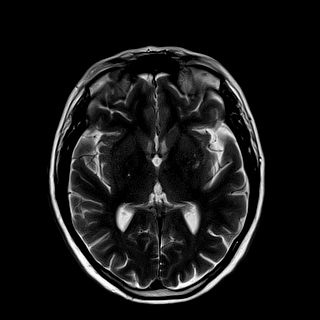
[im 20/20]
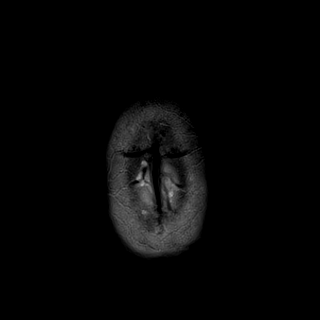

[Series 16: ax hemo · axial · 5.0mm · 0.86mm/px · z∈[-53,+91]mm · 3 of 25 slices shown]
[im 1/25]
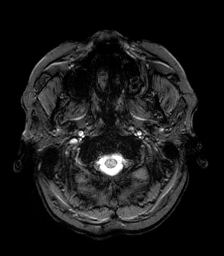
[im 13/25]
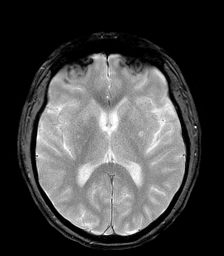
[im 25/25]
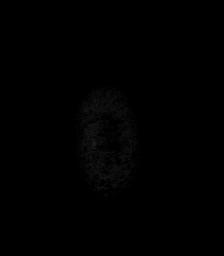

[Series 17: FLAIR · axial · 4.0mm · 0.43mm/px · z∈[-59,+97]mm · 5 of 40 slices shown]
[im 1/40]
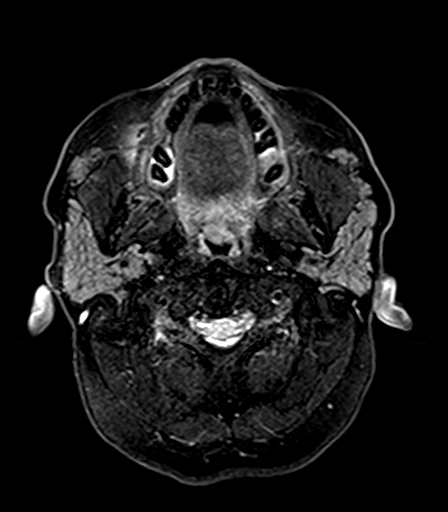
[im 10/40]
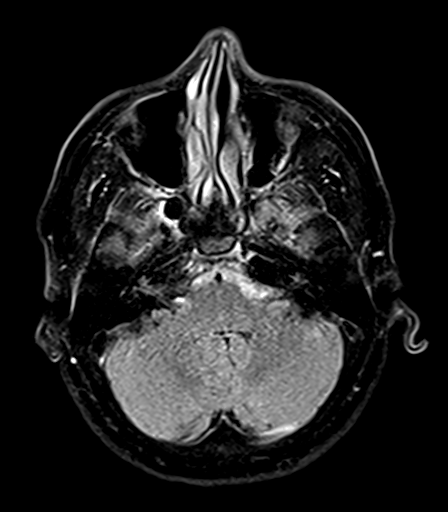
[im 20/40]
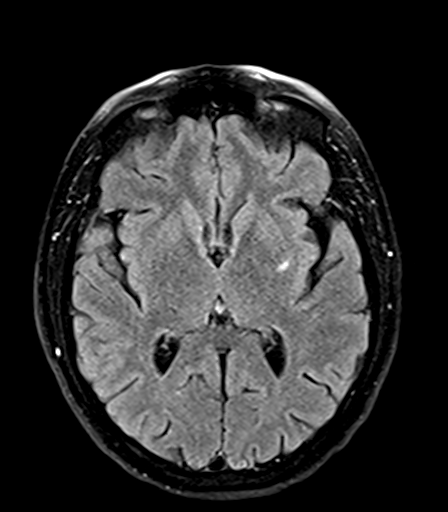
[im 30/40]
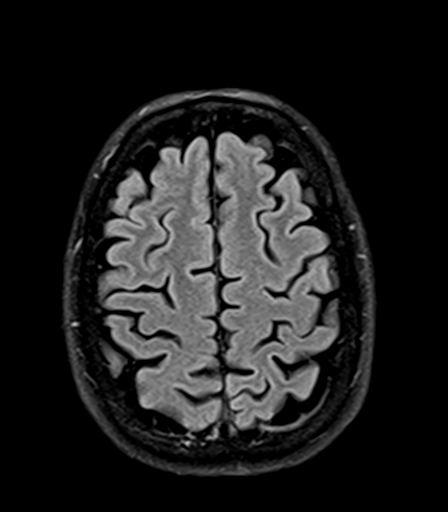
[im 40/40]
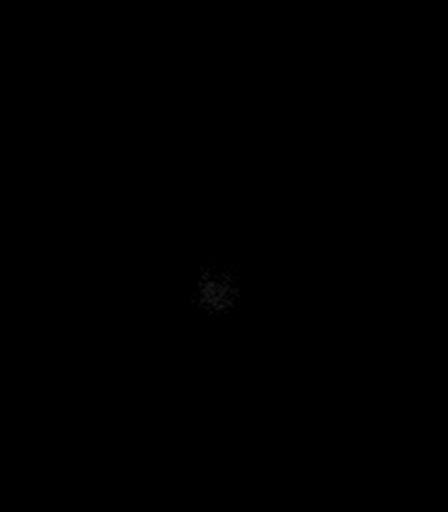

[40 of 48 positions shown; findings below may reference images not displayed]

FINDINGS: Brain: Acute left basal ganglia perforator infarct. Associated edema
without mass effect. No evidence of acute hemorrhage, hydrocephalus,
extra-axial fluid collection, mass lesion, or midline shift.

Vascular: Major arterial flow voids are maintained at the skull
base. Better evaluated on recent CTA.

Skull and upper cervical spine: Normal marrow signal.

Sinuses/Orbits: Mucosal thickening the left maxillary sinus and
anterior left ethmoid air cells. Otherwise, clear. No acute orbital
findings.

Other: No mastoid effusions.
IMPRESSION: Acute left basal ganglia perforator infarct.

## 2023-03-08 ENCOUNTER — Other Ambulatory Visit: Payer: Self-pay | Admitting: Family Medicine

## 2023-03-09 ENCOUNTER — Other Ambulatory Visit: Payer: Self-pay | Admitting: Family Medicine

## 2023-03-16 ENCOUNTER — Encounter: Payer: Self-pay | Admitting: *Deleted

## 2023-04-12 ENCOUNTER — Ambulatory Visit: Payer: 59 | Admitting: Family Medicine

## 2023-04-12 ENCOUNTER — Encounter: Payer: Self-pay | Admitting: Family Medicine

## 2023-04-12 VITALS — BP 138/68 | HR 80 | Temp 98.5°F | Ht 68.5 in | Wt 147.0 lb

## 2023-04-12 DIAGNOSIS — J0100 Acute maxillary sinusitis, unspecified: Secondary | ICD-10-CM | POA: Diagnosis not present

## 2023-04-12 MED ORDER — AMOXICILLIN-POT CLAVULANATE 875-125 MG PO TABS: 1.0000 | ORAL_TABLET | Freq: Two times a day (BID) | ORAL | 0 refills | Status: DC

## 2023-04-12 MED ORDER — PROMETHAZINE-DM 6.25-15 MG/5ML PO SYRP: 5.0000 mL | ORAL_SOLUTION | Freq: Four times a day (QID) | ORAL | 0 refills | Status: DC | PRN

## 2023-04-12 NOTE — Progress Notes (Signed)
Subjective:  Patient ID: Victor Gonzales, male    DOB: 1961-05-20  Age: 62 y.o. MRN: [id number]  CC:   Chief Complaint  Patient presents with   Nasal Congestion    Facial and head pain , jaw pain since thursday   Cough    Up 3 nights coughing    HPI:  62 year old male presents for an acute visit.   Symptoms start Thursday. He reports sinus congestion, facial pain, sore throat, cough. Symptoms are severe. No fever. No relieving factors. No known exacerbating factors. No other complaints.  Patient Active Problem List   Diagnosis Date Noted   Acute maxillary sinusitis 04/12/2023   Depression, major, single episode, moderate (HCC) 11/12/2022   Proteinuria 05/12/2022   Atherosclerosis of aorta (HCC) 02/11/2022   Type 2 diabetes mellitus with other circulatory complications (HCC) 02/10/2022   ASCVD (arteriosclerotic cardiovascular disease) 11/26/2021   CVA (cerebral vascular accident) (HCC) 10/05/2021   Hyperlipidemia 07/21/2021   Fatty liver 03/25/2020   HTN (hypertension) 10/26/2012    Social Hx   Social History   Socioeconomic History   Marital status: Married    Spouse name: Not on file   Number of children: Not on file   Years of education: Not on file   Highest education level: Not on file  Occupational History   Not on file  Tobacco Use   Smoking status: Never   Smokeless tobacco: Never  Vaping Use   Vaping status: Never Used  Substance and Sexual Activity   Alcohol use: No   Drug use: No   Sexual activity: Not on file  Other Topics Concern   Not on file  Social History Narrative   Not on file   Social Determinants of Health   Financial Resource Strain: Not on file  Food Insecurity: Not on file  Transportation Needs: Not on file  Physical Activity: Not on file  Stress: Not on file  Social Connections: Not on file    Review of Systems Per HPI  Objective:  BP 138/68   Pulse 80   Temp 98.5 F (36.9 C)   Ht 5' 8.5" (1.74 m)   Wt 147 lb (66.7  kg)   SpO2 96%   BMI 22.03 kg/m      04/12/2023    4:17 PM 11/11/2022    1:50 PM 11/11/2022    1:39 PM  BP/Weight  Systolic BP 138 136 151  Diastolic BP 68 71 77  Wt. (Lbs) 147  149  BMI 22.03 kg/m2  22.33 kg/m2    Physical Exam Vitals and nursing note reviewed.  Constitutional:      General: He is not in acute distress. HENT:     Head: Normocephalic and atraumatic.     Right Ear: Tympanic membrane normal.     Left Ear: Tympanic membrane normal.     Nose:     Comments: Maxillary sinus tenderness to palpation.    Mouth/Throat:     Pharynx: Oropharynx is clear.  Eyes:     General:        Right eye: No discharge.        Left eye: No discharge.     Conjunctiva/sclera: Conjunctivae normal.  Cardiovascular:     Rate and Rhythm: Normal rate and regular rhythm.  Pulmonary:     Effort: Pulmonary effort is normal.     Breath sounds: Normal breath sounds. No wheezing, rhonchi or rales.  Neurological:     Mental Status: He is  alert.     Lab Results  Component Value Date   WBC 8.1 11/11/2022   HGB 17.2 11/11/2022   HCT 53.9 (H) 11/11/2022   PLT 197 11/11/2022   GLUCOSE 179 (H) 12/10/2022   CHOL 95 (L) 08/10/2022   TRIG 99 08/10/2022   HDL 48 08/10/2022   LDLCALC 28 08/10/2022   ALT 37 12/10/2022   AST 33 12/10/2022   NA 142 12/10/2022   K 4.3 12/10/2022   CL 100 12/10/2022   CREATININE 0.91 12/10/2022   BUN 14 12/10/2022   CO2 24 12/10/2022   PSA 0.31 05/31/2014   INR 1.0 10/05/2021   HGBA1C 7.7 (H) 11/11/2022   MICROALBUR 2.60 (H) 04/29/2013     Assessment & Plan:   Problem List Items Addressed This Visit       Respiratory   Acute maxillary sinusitis - Primary    Treating with Augmentin. Promethazine DM for associated cough.      Relevant Medications   amoxicillin-clavulanate (AUGMENTIN) 875-125 MG tablet   promethazine-dextromethorphan (PROMETHAZINE-DM) 6.25-15 MG/5ML syrup    Meds ordered this encounter  Medications   amoxicillin-clavulanate  (AUGMENTIN) 875-125 MG tablet    Sig: Take 1 tablet by mouth 2 (two) times daily.    Dispense:  20 tablet    Refill:  0   promethazine-dextromethorphan (PROMETHAZINE-DM) 6.25-15 MG/5ML syrup    Sig: Take 5 mLs by mouth 4 (four) times daily as needed for cough.    Dispense:  118 mL    Refill:  0    Follow-up:  Return if symptoms worsen or fail to improve.  Everlene Other DO [facility]

## 2023-04-12 NOTE — Assessment & Plan Note (Signed)
Treating with Augmentin. Promethazine DM for associated cough.

## 2023-04-14 ENCOUNTER — Ambulatory Visit: Payer: 59 | Admitting: Cardiology

## 2023-05-13 ENCOUNTER — Ambulatory Visit: Payer: 59 | Admitting: Family Medicine

## 2023-05-13 VITALS — BP 142/72 | HR 72 | Temp 97.2°F | Ht 68.5 in | Wt 149.0 lb

## 2023-05-13 DIAGNOSIS — I10 Essential (primary) hypertension: Secondary | ICD-10-CM

## 2023-05-13 DIAGNOSIS — E1159 Type 2 diabetes mellitus with other circulatory complications: Secondary | ICD-10-CM

## 2023-05-13 DIAGNOSIS — J3489 Other specified disorders of nose and nasal sinuses: Secondary | ICD-10-CM | POA: Insufficient documentation

## 2023-05-13 DIAGNOSIS — Z7984 Long term (current) use of oral hypoglycemic drugs: Secondary | ICD-10-CM

## 2023-05-13 DIAGNOSIS — E785 Hyperlipidemia, unspecified: Secondary | ICD-10-CM

## 2023-05-13 MED ORDER — IPRATROPIUM BROMIDE 0.06 % NA SOLN: 2.0000 | Freq: Four times a day (QID) | NASAL | 0 refills | Status: DC | PRN

## 2023-05-13 MED ORDER — EMPAGLIFLOZIN 10 MG PO TABS: 10.0000 mg | ORAL_TABLET | Freq: Every day | ORAL | 1 refills | Status: DC

## 2023-05-13 MED ORDER — GLIMEPIRIDE 4 MG PO TABS: 4.0000 mg | ORAL_TABLET | Freq: Two times a day (BID) | ORAL | 1 refills | Status: DC

## 2023-05-13 NOTE — Assessment & Plan Note (Signed)
Atrovent as prescribed.

## 2023-05-13 NOTE — Patient Instructions (Signed)
Lab today.  Bring by paperwork.  Medication as directed.  Follow up in 3 months.

## 2023-05-13 NOTE — Progress Notes (Signed)
Subjective:  Patient ID: Victor Gonzales, male    DOB: 1961-02-11  Age: 62 y.o. MRN: [id number]  CC:   Chief Complaint  Patient presents with   Hypertension    HPI:  62 year old male with the below mentioned medical problems presents for follow-up.  BP mildly elevated here today.  He endorses compliance with his medication.  He is on bisoprolol/HCTZ and ramipril.  Needs A1c to reassess.  He is on metformin 1000 mg twice daily, Amaryl 4 mg twice daily, and [place].  He states that his Marcelline Deist will need to be switched to [place] starting January 1 based on his insurance.  Patient reports ongoing runny nose.  He has recently completed antibiotic therapy for sinusitis.  Some cough.  Lipids have been at goal on Crestor.  Patient Active Problem List   Diagnosis Date Noted   Rhinorrhea 05/13/2023   Depression, major, single episode, moderate (HCC) 11/12/2022   Proteinuria 05/12/2022   Atherosclerosis of aorta (HCC) 02/11/2022   Type 2 diabetes mellitus with other circulatory complications (HCC) 02/10/2022   ASCVD (arteriosclerotic cardiovascular disease) 11/26/2021   CVA (cerebral vascular accident) (HCC) 10/05/2021   Hyperlipidemia 07/21/2021   Fatty liver 03/25/2020   HTN (hypertension) 10/26/2012    Social Hx   Social History   Socioeconomic History   Marital status: Married    Spouse name: Not on file   Number of children: Not on file   Years of education: Not on file   Highest education level: Not on file  Occupational History   Not on file  Tobacco Use   Smoking status: Never   Smokeless tobacco: Never  Vaping Use   Vaping status: Never Used  Substance and Sexual Activity   Alcohol use: No   Drug use: No   Sexual activity: Not on file  Other Topics Concern   Not on file  Social History Narrative   Not on file   Social Drivers of Health   Financial Resource Strain: Not on file  Food Insecurity: Not on file  Transportation Needs: Not on file  Physical  Activity: Not on file  Stress: Not on file  Social Connections: Not on file    Review of Systems Per HPI  Objective:  BP (!) 142/72   Pulse 72   Temp (!) 97.2 F (36.2 C)   Ht 5' 8.5" (1.74 m)   Wt 149 lb (67.6 kg)   SpO2 98%   BMI 22.33 kg/m      05/13/2023    2:22 PM 05/13/2023    2:15 PM 04/12/2023    4:17 PM  BP/Weight  Systolic BP 142 160 138  Diastolic BP 72 91 68  Wt. (Lbs)  149 147  BMI  22.33 kg/m2 22.03 kg/m2    Physical Exam Vitals and nursing note reviewed.  Constitutional:      General: He is not in acute distress.    Appearance: Normal appearance.  HENT:     Head: Normocephalic and atraumatic.  Eyes:     General:        Right eye: No discharge.        Left eye: No discharge.     Conjunctiva/sclera: Conjunctivae normal.  Cardiovascular:     Rate and Rhythm: Normal rate and regular rhythm.  Pulmonary:     Effort: Pulmonary effort is normal.     Breath sounds: Normal breath sounds. No wheezing, rhonchi or rales.  Neurological:     Mental Status: He is alert.  Psychiatric:        Mood and Affect: Mood normal.        Behavior: Behavior normal.     Lab Results  Component Value Date   WBC 8.1 11/11/2022   HGB 17.2 11/11/2022   HCT 53.9 (H) 11/11/2022   PLT 197 11/11/2022   GLUCOSE 179 (H) 12/10/2022   CHOL 95 (L) 08/10/2022   TRIG 99 08/10/2022   HDL 48 08/10/2022   LDLCALC 28 08/10/2022   ALT 37 12/10/2022   AST 33 12/10/2022   NA 142 12/10/2022   K 4.3 12/10/2022   CL 100 12/10/2022   CREATININE 0.91 12/10/2022   BUN 14 12/10/2022   CO2 24 12/10/2022   PSA 0.31 05/31/2014   INR 1.0 10/05/2021   HGBA1C 7.7 (H) 11/11/2022   MICROALBUR 2.60 (H) 04/29/2013     Assessment & Plan:   Problem List Items Addressed This Visit       Cardiovascular and Mediastinum   HTN (hypertension)   Fair control.  Will continue to monitor.  Continue current medications.      Type 2 diabetes mellitus with other circulatory complications (HCC)  - Primary   A1c to assess.  Patient will continue Amaryl and metformin.  Switching to Jardiance per insurance requirement.      Relevant Medications   glimepiride (AMARYL) 4 MG tablet   empagliflozin (JARDIANCE) 10 MG TABS tablet (Start on 05/26/2023)   Other Relevant Orders   Hemoglobin A1c     Other   Hyperlipidemia   Lipids at goal on Crestor.  Continue.      Rhinorrhea   Atrovent as prescribed.       Meds ordered this encounter  Medications   glimepiride (AMARYL) 4 MG tablet    Sig: Take 1 tablet (4 mg total) by mouth 2 (two) times daily.    Dispense:  180 tablet    Refill:  1   ipratropium (ATROVENT) 0.06 % nasal spray    Sig: Place 2 sprays into both nostrils 4 (four) times daily as needed for rhinitis.    Dispense:  15 mL    Refill:  0   empagliflozin (JARDIANCE) 10 MG TABS tablet    Sig: Take 1 tablet (10 mg total) by mouth daily before breakfast.    Dispense:  90 tablet    Refill:  1    Switching to Jardiance as of Jan 1 due to insurance request/requirement.    Follow-up:  Return in about 3 months (around 08/11/2023).  Everlene Other DO [facility]

## 2023-05-13 NOTE — Assessment & Plan Note (Signed)
Lipids at goal on Crestor.  Continue.

## 2023-05-13 NOTE — Assessment & Plan Note (Signed)
Fair control.  Will continue to monitor.  Continue current medications.

## 2023-05-13 NOTE — Assessment & Plan Note (Signed)
A1c to assess.  Patient will continue Amaryl and metformin.  Switching to Jardiance per insurance requirement.

## 2023-05-14 ENCOUNTER — Encounter: Payer: Self-pay | Admitting: Family Medicine

## 2023-05-14 LAB — HEMOGLOBIN A1C
Est. average glucose Bld gHb Est-mCnc: 200 mg/dL
Hgb A1c MFr Bld: 8.6 % — ABNORMAL HIGH (ref 4.8–5.6)

## 2023-05-31 ENCOUNTER — Other Ambulatory Visit: Payer: Self-pay | Admitting: Family Medicine

## 2023-05-31 MED ORDER — EMPAGLIFLOZIN 10 MG PO TABS: 10.0000 mg | ORAL_TABLET | Freq: Every day | ORAL | 1 refills | Status: DC

## 2023-07-30 ENCOUNTER — Encounter: Payer: Self-pay | Admitting: *Deleted

## 2023-07-31 ENCOUNTER — Encounter: Payer: Self-pay | Admitting: Family Medicine

## 2023-08-02 ENCOUNTER — Other Ambulatory Visit: Payer: Self-pay

## 2023-08-02 DIAGNOSIS — E1159 Type 2 diabetes mellitus with other circulatory complications: Secondary | ICD-10-CM

## 2023-08-02 MED ORDER — EMPAGLIFLOZIN 10 MG PO TABS: 10.0000 mg | ORAL_TABLET | Freq: Every day | ORAL | 1 refills | Status: DC

## 2023-08-03 ENCOUNTER — Encounter: Payer: Self-pay | Admitting: Neurology

## 2023-08-04 ENCOUNTER — Telehealth: Payer: Self-pay | Admitting: Neurology

## 2023-08-04 NOTE — Telephone Encounter (Signed)
 Addressed in TE 08/04/23

## 2023-08-04 NOTE — Telephone Encounter (Addendum)
 Wife has called to report that she has recently been advised that the insurance company will only cover the test (VAS  Carotid ) to be done at [facility] ph [phone] fax [phone].  Wife also asking, since pt has not had this test done does he need to r/s the f/u for next week or keep that appointment.

## 2023-08-04 NOTE — Telephone Encounter (Signed)
 The order they are referring to is a year old so I am not sure if you still want them to get it? We dont have insurance for him in his chart, but he can call [facility] to schedule if he would like to go to that location. [phone]

## 2023-08-04 NOTE — Telephone Encounter (Signed)
 I called and spoke with patient wife Okey Regal and explained that ultrasound must be done at hospital. When I placed the order no location was listed for [facility]. Okey Regal said she has been on phone several hours back and forth with insurance company (pt has Monia Pouch on wife insurance) I asked Okey Regal to upload front and back of card to my chart so we can have information on file.   I would have to place a new order because current order was not listed in the appointment tab, only can be seen in the chart. Okey Regal said not to place new order and they will follow up with Dr.Sethi on 08/10/23.

## 2023-08-10 ENCOUNTER — Ambulatory Visit: Payer: 59 | Admitting: Neurology

## 2023-08-10 ENCOUNTER — Encounter: Payer: Self-pay | Admitting: Neurology

## 2023-08-10 VITALS — BP 146/81 | HR 71 | Ht 68.5 in | Wt 152.4 lb

## 2023-08-10 DIAGNOSIS — Z8673 Personal history of transient ischemic attack (TIA), and cerebral infarction without residual deficits: Secondary | ICD-10-CM

## 2023-08-10 DIAGNOSIS — G43009 Migraine without aura, not intractable, without status migrainosus: Secondary | ICD-10-CM

## 2023-08-10 DIAGNOSIS — G44209 Tension-type headache, unspecified, not intractable: Secondary | ICD-10-CM

## 2023-08-10 MED ORDER — TOPIRAMATE 25 MG PO TABS: 25.0000 mg | ORAL_TABLET | Freq: Two times a day (BID) | ORAL | 3 refills | Status: AC

## 2023-08-10 NOTE — Progress Notes (Signed)
 [facility] [address] [place]. [place] [address] [phone]       OFFICE FOLLOW UP VISITNOTE  [name] Date of Birth:  1960/05/31 Medical Record Number:  [id number]   Ref MD : Osvaldo Shipper  Reason for referral : Stroke  HPI: Initial Visit 12/16/2021 : [name] is a 63 year old Caucasian male seen today for initial office consultation visit for stroke.  He is accompanied by his wife.  History is obtained from them and review of electronic medical records and opossum reviewed pertinent available imaging films in PACS.  He has past medical history of diabetes, hypertension, hyperlipidemia and anxiety.  Presented to [facility] emergency room on 10/05/2021 with sudden onset of trouble writing and using his right hand as well as some slurred speech when he woke up in the morning that day.  He was evaluated by telemetry neurologist Dr. Melynda Ripple and found to have resolving symptoms with NIH stroke scale of 0.  He was admitted for stroke work-up and MRI scan showed a small left basal ganglia lacunar infarct.  CT angiogram showed mild atherosclerotic changes with mild right M1 and A1 stenosis.  2D echo showed normal ejection fraction of 60 to 65% without cardiac source of embolism.  Telemetry monitoring did not reveal cardiac arrhythmias.  Hemoglobin A1c was 7.0 and LDL cholesterol was at goal at 34 mg percent.  Patient was on no antiplatelets prior to admission and he was started on dual antiplatelet therapy aspirin and Plavix for 3 weeks and then aspirin alone but he has stayed on both medications so far.  He does complain of bruising easily.  His symptoms resolved completely while he was in the hospital and has had no recurrent stroke or TIA symptoms.  He states his sugars are doing well and his fasting glucose usually are in the 110 range.  He is tolerating Crestor well without muscle aches and pains.  Blood pressure is under good control and today it is slightly high  at 165/82.  He is quite active and working his job as well as outdoors in his home.  He has no new complaints today.  He has no prior history of strokes, TIA, migraines.  Last few weeks has been having a minor headache which is bifrontal dull aching without any accompanying light or sound sensitivity and is not disabling not requiring medications.  Update 08/10/2022 : He returns for follow-up after initial consultation visit in July 2023.  He is accompanied by his wife.  He states he continues to have weakness and he often drops things from his hands.  He tires quite easily.  Occasionally stroke versus has some drooling from the corner of his mouth particularly when he is tired.  Patient states.  Not able to return to work since his work involves using his hands constantly.  He is thinking about applying for disability.  He also complains of headaches which occur once a week or so.  He takes Tylenol which helps.  He is tolerating aspirin well but does bruise easily.  He has had no bleeding episodes. Blood pressure is normally under good control though today was elevated at 159/89.  He states his diabetes is well-controlled her last A1c on 05/12/2022 was 7.1.  He is tolerating Crestor well without muscle aches and pains. Update 08/10/2023 : Patient i returns for follow-up after last visit a year ago.  He is accompanied by his wife.  He has new complaints of headaches for the  last 3 to 4 weeks.  The headaches have been daily and persistent.  He describes the headache as moderate to severe in intensity pressure-like at times with occasional sharp and throbbing.  Headaches are sensitive to light and sound.  He feels better if he lies down and sleeps.  Headaches do increase with physical activity.  Tylenol seems to help somewhat but headaches can come back.  The headaches have been occurring about 4 to 5 days a week for the last few weeks.  He denies any known prior history of migraine headaches.  He does get some  ringing in his years at times.  He does complain of some cracking sound in his neck when he moves his neck.  He does admit to muscle tightness in the back of his neck.  He denies any neck injury fall or motor vehicle accident.  He remains on aspirin she is tolerating well without bruising or bleeding.  He states his blood pressure is under good control and today it is 146/81.  States his sugars are under good control.  He is tolerating Crestor well without muscle aches and pains.  Last lab work on 08/10/2022 showed LDL-cholesterol to be optimal at 28 mg percent and hemoglobin A1c borderline at 7.1.  The latest hemoglobin A1c was however elevated at 8.6 on 05/03/2023  ROS:   14 system review of systems is positive for dysarthria, and weakness, bruising, petechiae, headache all other systems negative  PMH:  Past Medical History:  Diagnosis Date   Anxiety    Cancer (HCC)    Skin   Essential hypertension    GERD (gastroesophageal reflux disease)    Hepatic steatosis    History of stroke    May 2023   Hyperlipidemia    Type 2 diabetes mellitus (HCC)     Social History:  Social History   Socioeconomic History   Marital status: Married    Spouse name: Not on file   Number of children: Not on file   Years of education: Not on file   Highest education level: Not on file  Occupational History   Not on file  Tobacco Use   Smoking status: Never   Smokeless tobacco: Never  Vaping Use   Vaping status: Never Used  Substance and Sexual Activity   Alcohol use: No   Drug use: No   Sexual activity: Not on file  Other Topics Concern   Not on file  Social History Narrative   Not on file   Social Drivers of Health   Financial Resource Strain: Not on file  Food Insecurity: Not on file  Transportation Needs: Not on file  Physical Activity: Not on file  Stress: Not on file  Social Connections: Not on file  Intimate Partner Violence: Not on file    Medications:   Current Outpatient  Medications on File Prior to Visit  Medication Sig Dispense Refill   Accu-Chek FastClix Lancets MISC Use to check blood sugar once a day Dx: E11.9 100 each 1   amLODipine (NORVASC) 10 MG tablet TAKE 1 TABLET DAILY 90 tablet 1   aspirin 81 MG tablet Take 1 tablet (81 mg total) by mouth daily. Takes one every other day (Patient taking differently: Take 81 mg by mouth daily.) 30 tablet 3   bisoprolol-hydrochlorothiazide (ZIAC) 10-6.25 MG tablet TAKE 1 TABLET DAILY 90 tablet 1   dapagliflozin propanediol (FARXIGA) 10 MG TABS tablet Take 10 mg by mouth daily.     glimepiride (AMARYL) 4  MG tablet Take 1 tablet (4 mg total) by mouth 2 (two) times daily. 180 tablet 1   Glucose Blood (BLOOD GLUCOSE TEST STRIPS 333) STRP 1 Device by Other route daily. 100 strip 2   MAGNESIUM PO Take by mouth daily.     metFORMIN (GLUCOPHAGE) 500 MG tablet TAKE 2 TABLETS (1000MG ) TWOTIMES A DAY 360 tablet 1   MISC NATURAL PRODUCTS PO Take 1 capsule by mouth daily. Liver Well     Multiple Vitamin (MULTIVITAMIN) tablet Take 1 tablet by mouth daily.     ramipril (ALTACE) 10 MG capsule TAKE 2 CAPSULES DAILY 180 capsule 1   rosuvastatin (CRESTOR) 10 MG tablet TAKE 1 TABLET(10 MG) BY MOUTH DAILY 90 tablet 1   empagliflozin (JARDIANCE) 10 MG TABS tablet Take 1 tablet (10 mg total) by mouth daily before breakfast. (Patient not taking: Reported on 08/10/2023) 90 tablet 1   No current facility-administered medications on file prior to visit.    Allergies:   Allergies  Allergen Reactions   Codeine Other (See Comments)    Unknown    Physical Exam General: well developed, well nourished, pleasant middle-age Caucasian male seated, in no evident distress Head: head normocephalic and atraumatic.  Neck: supple with no carotid or supraclavicular bruits Cardiovascular: regular rate and rhythm, no murmurs Musculoskeletal: no deformity Skin:  no rash scattered bilateral forearm petichiae Vascular:  Normal pulses all  extremities Vitals:   08/10/23 1251  BP: (!) 146/81  Pulse: 71   Neurologic Exam Mental Status: Awake and fully alert. Oriented to place and time. Recent and remote memory intact. Attention span, concentration and fund of knowledge appropriate. Mood and affect appropriate.  Cranial Nerves: Fundoscopic exam reveals sharp disc margins. Pupils equal, briskly reactive to light. Extraocular movements full without nystagmus. Visual fields full to confrontation. Hearing intact. Facial sensation intact. Face, tongue, palate moves normally and symmetrically.  Motor: Normal bulk and tone. Normal strength in all tested extremity muscles.  Weakness of left grip and intrinsic hand muscles.  Orbits right over left upper extremity.  Left grip is weaker than right but there is giveaway Sensory.: intact to touch ,pinprick .position and vibratory sensation.  Coordination: Rapid alternating movements normal in all extremities. Finger-to-nose and heel-to-shin performed accurately bilaterally. Gait and Station: Arises from chair without difficulty. Stance is normal. Gait demonstrates normal stride length and balance . Able to heel, toe and tandem walk without difficulty.  Reflexes: 1+ and symmetric. Toes downgoing.       ASSESSMENT: 63 year old Caucasian male with transient episode of dysarthria and right hand weakness in May 2023 due to left subcortical infarct from small vessel disease.  Vascular risk factors of diabetes, hypertension and hyperlipidemia.  Patient is doing well from neurovascular standpoint with near complete recovery.  Chronic headaches likely mixed migraine with muscle tension headache    PLAN:I had a long d/w patient and his wife about his remote stroke,  post stroke hand weakness, mixed migraine and tension headaches, risk for recurrent stroke/TIAs, personally independently reviewed imaging studies and stroke evaluation results and answered questions.Continue aspirin 81 mg daily  for secondary  stroke prevention and maintain strict control of hypertension with blood pressure goal below 130/90, diabetes with hemoglobin A1c goal below 6.5% and lipids with LDL cholesterol goal below 70 mg/dL. I also advised the patient to eat a healthy diet with plenty of whole grains, cereals, fruits and vegetables, exercise regularly and maintain ideal body weight .patient unfortunately still has residual weakness and stamina measures from his  stroke and will be unable to return to work.   Trial of Topamax 25 mg twice daily for for headache prevention and also encouraged him to do regular neck stretching exercises.  I also encouraged him to increase participation in daily stress relaxation activities like meditation, yoga and regular exercises to help with his tension headaches.  Followup in the future with me in 1 year or call earlier if necessary..Greater than 50% of time during this 40 minnute   visit was spent on counseling,explanation of diagnosis of lacunar stroke, planning of further management, discussion with patient and family and coordination of care Delia Heady, MD Note: This document was prepared with digital dictation and possible smart phrase technology. Any transcriptional errors that result from this process are unintentional

## 2023-08-10 NOTE — Patient Instructions (Addendum)
 had a long d/w patient and his wife about his remote stroke,  post stroke hand weakness, mixed migraine and tension headaches, risk for recurrent stroke/TIAs, personally independently reviewed imaging studies and stroke evaluation results and answered questions.Continue aspirin 81 mg daily  for secondary stroke prevention and maintain strict control of hypertension with blood pressure goal below 130/90, diabetes with hemoglobin A1c goal below 6.5% and lipids with LDL cholesterol goal below 70 mg/dL. I also advised the patient to eat a healthy diet with plenty of whole grains, cereals, fruits and vegetables, exercise regularly and maintain ideal body weight .patient unfortunately still has residual weakness and stamina measures from his stroke and will be unable to return to work.   Trial of Topamax 25 mg twice daily for for headache prevention and also encouraged him to do regular neck stretching exercises.  I also encouraged him to increase participation in daily stress relaxation activities like meditation, yoga and regular exercises to help with his tension headaches.  Followup in the future with me in 1 year or call earlier if necessary.  Neck Exercises Ask your health care provider which exercises are safe for you. Do exercises exactly as told by your health care provider and adjust them as directed. It is normal to feel mild stretching, pulling, tightness, or discomfort as you do these exercises. Stop right away if you feel sudden pain or your pain gets worse. Do not begin these exercises until told by your health care provider. Neck exercises can be important for many reasons. They can improve strength and maintain flexibility in your neck, which will help your upper back and prevent neck pain. Stretching exercises Rotation neck stretching  Sit in a chair or stand up. Place your feet flat on the floor, shoulder-width apart. Slowly turn your head (rotate) to the right until a slight stretch is felt.  Turn it all the way to the right so you can look over your right shoulder. Do not tilt or tip your head. Hold this position for 10-30 seconds. Slowly turn your head (rotate) to the left until a slight stretch is felt. Turn it all the way to the left so you can look over your left shoulder. Do not tilt or tip your head. Hold this position for 10-30 seconds. Repeat __________ times. Complete this exercise __________ times a day. Neck retraction  Sit in a sturdy chair or stand up. Look straight ahead. Do not bend your neck. Use your fingers to push your chin backward (retraction). Do not bend your neck for this movement. Continue to face straight ahead. If you are doing the exercise properly, you will feel a slight sensation in your throat and a stretch at the back of your neck. Hold the stretch for 1-2 seconds. Repeat __________ times. Complete this exercise __________ times a day. Strengthening exercises Neck press  Lie on your back on a firm bed or on the floor with a pillow under your head. Use your neck muscles to push your head down on the pillow and straighten your spine. Hold the position as well as you can. Keep your head facing up (in a neutral position) and your chin tucked. Slowly count to 5 while holding this position. Repeat __________ times. Complete this exercise __________ times a day. Isometrics These are exercises in which you strengthen the muscles in your neck while keeping your neck still (isometrics). Sit in a supportive chair and place your hand on your forehead. Keep your head and face facing straight ahead.  Do not flex or extend your neck while doing isometrics. Push forward with your head and neck while pushing back with your hand. Hold for 10 seconds. Do the sequence again, this time putting your hand against the back of your head. Use your head and neck to push backward against the hand pressure. Finally, do the same exercise on either side of your head, pushing  sideways against the pressure of your hand. Repeat __________ times. Complete this exercise __________ times a day. Prone head lifts  Lie face-down (prone position), resting on your elbows so that your chest and upper back are raised. Start with your head facing downward, near your chest. Position your chin either on or near your chest. Slowly lift your head upward. Lift until you are looking straight ahead. Then continue lifting your head as far back as you can comfortably stretch. Hold your head up for 5 seconds. Then slowly lower it to your starting position. Repeat __________ times. Complete this exercise __________ times a day. Supine head lifts  Lie on your back (supine position), bending your knees to point to the ceiling and keeping your feet flat on the floor. Lift your head slowly off the floor, raising your chin toward your chest. Hold for 5 seconds. Repeat __________ times. Complete this exercise __________ times a day. Scapular retraction  Stand with your arms at your sides. Look straight ahead. Slowly pull both shoulders (scapulae) backward and downward (retraction) until you feel a stretch between your shoulder blades in your upper back. Hold for 10-30 seconds. Relax and repeat. Repeat __________ times. Complete this exercise __________ times a day. Contact a health care provider if: Your neck pain or discomfort gets worse when you do an exercise. Your neck pain or discomfort does not improve within 2 hours after you exercise. If you have any of these problems, stop exercising right away. Do not do the exercises again unless your health care provider says that you can. Get help right away if: You develop sudden, severe neck pain. If this happens, stop exercising right away. Do not do the exercises again unless your health care provider says that you can. This information is not intended to replace advice given to you by your health care provider. Make sure you discuss any  questions you have with your health care provider. Document Revised: 11/05/2020 Document Reviewed: 11/05/2020 Elsevier Patient Education  2024 [employer]  Stroke Prevention Some medical conditions and behaviors can lead to a higher chance of having a stroke. You can help prevent a stroke by eating healthy, exercising, not smoking, and managing any medical conditions you have. Stroke is a leading cause of functional impairment. Primary prevention is particularly important because a majority of strokes are first-time events. Stroke changes the lives of not only those who experience a stroke but also their family and other caregivers. How can this condition affect me? A stroke is a medical emergency and should be treated right away. A stroke can lead to brain damage and can sometimes be life-threatening. If a person gets medical treatment right away, there is a better chance of surviving and recovering from a stroke. What can increase my risk? The following medical conditions may increase your risk of a stroke: Cardiovascular disease. High blood pressure (hypertension). Diabetes. High cholesterol. Sickle cell disease. Blood clotting disorders (hypercoagulable state). Obesity. Sleep disorders (obstructive sleep apnea). Other risk factors include: Being older than age 48. Having a history of blood clots, stroke, or mini-stroke (transient ischemic attack, TIA).  Genetic factors, such as race, ethnicity, or a family history of stroke. Smoking cigarettes or using other tobacco products. Taking birth control pills, especially if you also use tobacco. Heavy use of alcohol or drugs, especially cocaine and methamphetamine. Physical inactivity. What actions can I take to prevent this? Manage your health conditions High cholesterol levels. Eating a healthy diet is important for preventing high cholesterol. If cholesterol cannot be managed through diet alone, you may need to take medicines. Take  any prescribed medicines to control your cholesterol as told by your health care provider. Hypertension. To reduce your risk of stroke, try to keep your blood pressure below 130/80. Eating a healthy diet and exercising regularly are important for controlling blood pressure. If these steps are not enough to manage your blood pressure, you may need to take medicines. Take any prescribed medicines to control hypertension as told by your health care provider. Ask your health care provider if you should monitor your blood pressure at home. Have your blood pressure checked every year, even if your blood pressure is normal. Blood pressure increases with age and some medical conditions. Diabetes. Eating a healthy diet and exercising regularly are important parts of managing your blood sugar (glucose). If your blood sugar cannot be managed through diet and exercise, you may need to take medicines. Take any prescribed medicines to control your diabetes as told by your health care provider. Get evaluated for obstructive sleep apnea. Talk to your health care provider about getting a sleep evaluation if you snore a lot or have excessive sleepiness. Make sure that any other medical conditions you have, such as atrial fibrillation or atherosclerosis, are managed. Nutrition Follow instructions from your health care provider about what to eat or drink to help manage your health condition. These instructions may include: Reducing your daily calorie intake. Limiting how much salt (sodium) you use to 1,500 milligrams (mg) each day. Using only healthy fats for cooking, such as olive oil, canola oil, or sunflower oil. Eating healthy foods. You can do this by: Choosing foods that are high in fiber, such as whole grains, and fresh fruits and vegetables. Eating at least 5 servings of fruits and vegetables a day. Try to fill one-half of your plate with fruits and vegetables at each meal. Choosing lean protein foods, such  as lean cuts of meat, poultry without skin, fish, tofu, beans, and nuts. Eating low-fat dairy products. Avoiding foods that are high in sodium. This can help lower blood pressure. Avoiding foods that have saturated fat, trans fat, and cholesterol. This can help prevent high cholesterol. Avoiding processed and prepared foods. Counting your daily carbohydrate intake.  Lifestyle If you drink alcohol: Limit how much you have to: 0-1 drink a day for women who are not pregnant. 0-2 drinks a day for men. Know how much alcohol is in your drink. In the [place] one drink equals one 12 oz bottle of beer ( ), one 5 oz glass of wine ( ), or one 1 oz glass of hard liquor (44mL). Do not use any products that contain nicotine or tobacco. These products include cigarettes, chewing tobacco, and vaping devices, such as e-cigarettes. If you need help quitting, ask your health care provider. Avoid secondhand smoke. Do not use drugs. Activity  Try to stay at a healthy weight. Get at least 30 minutes of exercise on most days, such as: Fast walking. Biking. Swimming. Medicines Take over-the-counter and prescription medicines only as told by your health care provider. Aspirin or blood thinners (antiplatelets  or anticoagulants) may be recommended to reduce your risk of forming blood clots that can lead to stroke. Avoid taking birth control pills. Talk to your health care provider about the risks of taking birth control pills if: You are over 3 years old. You smoke. You get very bad headaches. You have had a blood clot. Where to find more information American Stroke Association: www.strokeassociation.org Get help right away if: You or a loved one has any symptoms of a stroke. "BE FAST" is an easy way to remember the main warning signs of a stroke: B - Balance. Signs are dizziness, sudden trouble walking, or loss of balance. E - Eyes. Signs are trouble seeing or a sudden change in vision. F - Face.  Signs are sudden weakness or numbness of the face, or the face or eyelid drooping on one side. A - Arms. Signs are weakness or numbness in an arm. This happens suddenly and usually on one side of the body. S - Speech. Signs are sudden trouble speaking, slurred speech, or trouble understanding what people say. T - Time. Time to call emergency services. Write down what time symptoms started. You or a loved one has other signs of a stroke, such as: A sudden, severe headache with no known cause. Nausea or vomiting. Seizure. These symptoms may represent a serious problem that is an emergency. Do not wait to see if the symptoms will go away. Get medical help right away. Call your local emergency services (911 in the [place] Do not drive yourself to the hospital. Summary You can help to prevent a stroke by eating healthy, exercising, not smoking, limiting alcohol intake, and managing any medical conditions you may have. Do not use any products that contain nicotine or tobacco. These include cigarettes, chewing tobacco, and vaping devices, such as e-cigarettes. If you need help quitting, ask your health care provider. Remember "BE FAST" for warning signs of a stroke. Get help right away if you or a loved one has any of these signs. This information is not intended to replace advice given to you by your health care provider. Make sure you discuss any questions you have with your health care provider. Document Revised: 04/13/2022 Document Reviewed: 04/13/2022 Elsevier Patient Education  2024 [employer].

## 2023-08-11 ENCOUNTER — Ambulatory Visit: Payer: 59 | Admitting: Family Medicine

## 2023-08-11 VITALS — BP 141/71 | HR 74 | Temp 98.4°F | Wt 152.0 lb

## 2023-08-11 DIAGNOSIS — M542 Cervicalgia: Secondary | ICD-10-CM | POA: Diagnosis not present

## 2023-08-11 DIAGNOSIS — E1159 Type 2 diabetes mellitus with other circulatory complications: Secondary | ICD-10-CM | POA: Diagnosis not present

## 2023-08-11 DIAGNOSIS — I10 Essential (primary) hypertension: Secondary | ICD-10-CM | POA: Diagnosis not present

## 2023-08-11 DIAGNOSIS — E785 Hyperlipidemia, unspecified: Secondary | ICD-10-CM | POA: Diagnosis not present

## 2023-08-11 MED ORDER — RAMIPRIL 10 MG PO CAPS: ORAL_CAPSULE | ORAL | 3 refills | Status: DC

## 2023-08-11 MED ORDER — ROSUVASTATIN CALCIUM 10 MG PO TABS: 10.0000 mg | ORAL_TABLET | Freq: Every day | ORAL | 3 refills | Status: DC

## 2023-08-11 MED ORDER — RAMIPRIL 10 MG PO CAPS: ORAL_CAPSULE | ORAL | 3 refills | Status: AC

## 2023-08-11 MED ORDER — ROSUVASTATIN CALCIUM 10 MG PO TABS: 10.0000 mg | ORAL_TABLET | Freq: Every day | ORAL | 3 refills | Status: AC

## 2023-08-11 MED ORDER — AMLODIPINE BESYLATE 10 MG PO TABS: 10.0000 mg | ORAL_TABLET | Freq: Every day | ORAL | 3 refills | Status: AC

## 2023-08-11 NOTE — Progress Notes (Signed)
 Subjective:  Patient ID: Victor Gonzales, male    DOB: 1960/10/21  Age: 63 y.o. MRN: [id number]  CC:   Chief Complaint  Patient presents with   3 month follow up     DM, Htn, cholesterol     HPI:  63 year old male with above-mentioned medical problems presents for follow-up.  Patient recently seen by neurology.  Started on Topamax for headaches. Patient reports that he has been having some neck discomfort and "cracking" with range of motion.  BP mildly elevated here today.  He is compliant with amlodipine, bisoprolol/HCTZ and ramipril.  Diabetes remains uncontrolled.  He is currently on Farxiga 10 mg daily, glimepiride 4 mg twice daily, and metformin.  He is having some recent diarrhea.  Could possibly be from metformin.  Needs A1c.  LDL at goal Crestor.  Patient Active Problem List   Diagnosis Date Noted   Neck pain 08/11/2023   Depression, major, single episode, moderate (HCC) 11/12/2022   Atherosclerosis of aorta (HCC) 02/11/2022   Type 2 diabetes mellitus with other circulatory complications (HCC) 02/10/2022   ASCVD (arteriosclerotic cardiovascular disease) 11/26/2021   CVA (cerebral vascular accident) (HCC) 10/05/2021   Hyperlipidemia 07/21/2021   Fatty liver 03/25/2020   HTN (hypertension) 10/26/2012    Social Hx   Social History   Socioeconomic History   Marital status: Married    Spouse name: Not on file   Number of children: Not on file   Years of education: Not on file   Highest education level: Not on file  Occupational History   Not on file  Tobacco Use   Smoking status: Never   Smokeless tobacco: Never  Vaping Use   Vaping status: Never Used  Substance and Sexual Activity   Alcohol use: No   Drug use: No   Sexual activity: Not on file  Other Topics Concern   Not on file  Social History Narrative   Not on file   Social Drivers of Health   Financial Resource Strain: Not on file  Food Insecurity: Not on file  Transportation Needs: Not on file   Physical Activity: Not on file  Stress: Not on file  Social Connections: Not on file    Review of Systems Per HPI  Objective:  BP (!) 141/71   Pulse 74   Temp 98.4 F (36.9 C)   Wt 152 lb (68.9 kg)   SpO2 98%   BMI 22.78 kg/m      08/11/2023    2:54 PM 08/11/2023    2:27 PM 08/10/2023   12:51 PM  BP/Weight  Systolic BP 141 144 146  Diastolic BP 71 80 81  Wt. (Lbs)  152 152.4  BMI  22.78 kg/m2 22.84 kg/m2    Physical Exam Vitals and nursing note reviewed.  Constitutional:      General: He is not in acute distress.    Appearance: Normal appearance.  HENT:     Head: Normocephalic and atraumatic.  Eyes:     General:        Right eye: No discharge.        Left eye: No discharge.     Conjunctiva/sclera: Conjunctivae normal.  Cardiovascular:     Rate and Rhythm: Normal rate and regular rhythm.  Pulmonary:     Effort: Pulmonary effort is normal.     Breath sounds: Normal breath sounds. No wheezing, rhonchi or rales.  Neurological:     Mental Status: He is alert.  Psychiatric:  Mood and Affect: Mood normal.        Behavior: Behavior normal.     Lab Results  Component Value Date   WBC 8.1 11/11/2022   HGB 17.2 11/11/2022   HCT 53.9 (H) 11/11/2022   PLT 197 11/11/2022   GLUCOSE 179 (H) 12/10/2022   CHOL 95 (L) 08/10/2022   TRIG 99 08/10/2022   HDL 48 08/10/2022   LDLCALC 28 08/10/2022   ALT 37 12/10/2022   AST 33 12/10/2022   NA 142 12/10/2022   K 4.3 12/10/2022   CL 100 12/10/2022   CREATININE 0.91 12/10/2022   BUN 14 12/10/2022   CO2 24 12/10/2022   PSA 0.31 05/31/2014   INR 1.0 10/05/2021   HGBA1C 8.6 (H) 05/13/2023   MICROALBUR 2.60 (H) 04/29/2013     Assessment & Plan:  Type 2 diabetes mellitus with other circulatory complications (HCC) Assessment & Plan: Uncontrolled.  A1c ordered.  Continue Farxiga Amaryl and metformin.  Orders: -     Hemoglobin A1c  Neck pain Assessment & Plan: X-ray of the cervical spine for further  evaluation.  Orders: -     DG Cervical Spine Complete  Primary hypertension Assessment & Plan: BP mildly elevated here today.  If continues to be elevated will increase bisoprolol/HCTZ.  Amlodipine and ramipril already at max dose.  Orders: -     amLODIPine Besylate; Take 1 tablet (10 mg total) by mouth daily.  Dispense: 90 tablet; Refill: 3 -     Ramipril; TAKE 2 CAPSULES DAILY  Dispense: 180 capsule; Refill: 3  Hyperlipidemia, unspecified hyperlipidemia type Assessment & Plan: At goal on Crestor.  Orders: -     Rosuvastatin Calcium; Take 1 tablet (10 mg total) by mouth daily.  Dispense: 90 tablet; Refill: 3    Follow-up:  3 months  Nyelle Wolfson Adriana Simas DO [facility]

## 2023-08-11 NOTE — Assessment & Plan Note (Signed)
At goal on Crestor. 

## 2023-08-11 NOTE — Patient Instructions (Addendum)
 A1c in the next few days.  Continue your medications.  Xray at the hospital.  Follow up in 3 months.

## 2023-08-11 NOTE — Assessment & Plan Note (Signed)
 BP mildly elevated here today.  If continues to be elevated will increase bisoprolol/HCTZ.  Amlodipine and ramipril already at max dose.

## 2023-08-11 NOTE — Assessment & Plan Note (Signed)
 Uncontrolled.  A1c ordered.  Continue Farxiga Amaryl and metformin.

## 2023-08-11 NOTE — Assessment & Plan Note (Signed)
 X-ray of the cervical spine for further evaluation.

## 2023-08-17 ENCOUNTER — Encounter: Payer: Self-pay | Admitting: Family Medicine

## 2023-08-17 LAB — HEMOGLOBIN A1C
Est. average glucose Bld gHb Est-mCnc: 186 mg/dL
Hgb A1c MFr Bld: 8.1 % — ABNORMAL HIGH (ref 4.8–5.6)

## 2023-08-17 NOTE — Telephone Encounter (Signed)
---  note below is the patient's response to his lab results and recommendations.   Copied from CRM [id number]. Topic: Clinical - Lab/Test Results >> Aug 17, 2023 10:27 AM Clayton Bibles wrote: Reason for CRM:  Attn: Alm Bustard, LPN [place] called back and he does not want a referral to Endo (). He will try working on getting it to go down with his diet and walking daily.  If you have questions, please give him a call back.

## 2023-08-18 ENCOUNTER — Ambulatory Visit: Admitting: Family Medicine

## 2023-08-18 VITALS — BP 124/82 | HR 69 | Temp 97.7°F | Ht 68.5 in | Wt 148.8 lb

## 2023-08-18 DIAGNOSIS — M545 Low back pain, unspecified: Secondary | ICD-10-CM | POA: Insufficient documentation

## 2023-08-18 MED ORDER — BACLOFEN 10 MG PO TABS: 5.0000 mg | ORAL_TABLET | Freq: Three times a day (TID) | ORAL | 0 refills | Status: AC | PRN

## 2023-08-18 NOTE — Patient Instructions (Signed)
Rest.  Heat.  Medication as directed.  Take care  Dr. Dickey Caamano  

## 2023-08-18 NOTE — Assessment & Plan Note (Signed)
 Advised heat.  Baclofen as directed.  Supportive care.

## 2023-08-18 NOTE — Progress Notes (Signed)
 Subjective:  Patient ID: Victor Gonzales, male    DOB: 08/13/1960  Age: 63 y.o. MRN: [id number]  CC: Low back pain   HPI:  63 year old male presents for evaluation development  Patient reports that he developed right-sided low back pain yesterday.  He states that it occurred when he bent down to pick up a trash can.  Patient states that he feels like he pulled a muscle.  The pain is sharp.  Worse with certain movements.  He is better at rest.  No radicular symptoms.  No other complaints.  Patient Active Problem List   Diagnosis Date Noted   Acute low back pain 08/18/2023   Depression, major, single episode, moderate (HCC) 11/12/2022   Atherosclerosis of aorta (HCC) 02/11/2022   Type 2 diabetes mellitus with other circulatory complications (HCC) 02/10/2022   ASCVD (arteriosclerotic cardiovascular disease) 11/26/2021   CVA (cerebral vascular accident) (HCC) 10/05/2021   Hyperlipidemia 07/21/2021   Fatty liver 03/25/2020   HTN (hypertension) 10/26/2012    Social Hx   Social History   Socioeconomic History   Marital status: Married    Spouse name: Not on file   Number of children: Not on file   Years of education: Not on file   Highest education level: Not on file  Occupational History   Not on file  Tobacco Use   Smoking status: Never   Smokeless tobacco: Never  Vaping Use   Vaping status: Never Used  Substance and Sexual Activity   Alcohol use: No   Drug use: No   Sexual activity: Not on file  Other Topics Concern   Not on file  Social History Narrative   Not on file   Social Drivers of Health   Financial Resource Strain: Not on file  Food Insecurity: Not on file  Transportation Needs: Not on file  Physical Activity: Not on file  Stress: Not on file  Social Connections: Not on file    Review of Systems Per HPI  Objective:  BP 124/82   Pulse 69   Temp 97.7 F (36.5 C)   Ht 5' 8.5" (1.74 m)   Wt 148 lb 12.8 oz (67.5 kg)   SpO2 97%   BMI 22.30 kg/m       08/18/2023   10:42 AM 08/11/2023    2:54 PM 08/11/2023    2:27 PM  BP/Weight  Systolic BP 124 141 144  Diastolic BP 82 71 80  Wt. (Lbs) 148.8  152  BMI 22.3 kg/m2  22.78 kg/m2    Physical Exam Vitals and nursing note reviewed.  Constitutional:      General: He is not in acute distress.    Appearance: Normal appearance.  HENT:     Head: Normocephalic and atraumatic.  Cardiovascular:     Rate and Rhythm: Normal rate and regular rhythm.  Pulmonary:     Effort: Pulmonary effort is normal.     Breath sounds: Normal breath sounds.  Musculoskeletal:     Comments: Right paraspinal musculature lumbar spine tender to palpation.  Neurological:     Mental Status: He is alert.     Lab Results  Component Value Date   WBC 8.1 11/11/2022   HGB 17.2 11/11/2022   HCT 53.9 (H) 11/11/2022   PLT 197 11/11/2022   GLUCOSE 179 (H) 12/10/2022   CHOL 95 (L) 08/10/2022   TRIG 99 08/10/2022   HDL 48 08/10/2022   LDLCALC 28 08/10/2022   ALT 37 12/10/2022   AST 33  12/10/2022   NA 142 12/10/2022   K 4.3 12/10/2022   CL 100 12/10/2022   CREATININE 0.91 12/10/2022   BUN 14 12/10/2022   CO2 24 12/10/2022   PSA 0.31 05/31/2014   INR 1.0 10/05/2021   HGBA1C 8.1 (H) 08/16/2023   MICROALBUR 2.60 (H) 04/29/2013     Assessment & Plan:  Acute right-sided low back pain without sciatica Assessment & Plan: Advised heat.  Baclofen as directed.  Supportive care.   Other orders -     Baclofen; Take 0.5-1 tablets (5-10 mg total) by mouth 3 (three) times daily as needed for muscle spasms.  Dispense: 30 each; Refill: 0    Follow-up:  Return if symptoms worsen or fail to improve.  Everlene Other DO [facility]

## 2023-09-23 ENCOUNTER — Other Ambulatory Visit: Payer: Self-pay | Admitting: Family Medicine

## 2023-09-23 MED ORDER — BISOPROLOL-HYDROCHLOROTHIAZIDE 10-6.25 MG PO TABS: 1.0000 | ORAL_TABLET | Freq: Every day | ORAL | 3 refills | Status: DC

## 2023-09-23 MED ORDER — BISOPROLOL-HYDROCHLOROTHIAZIDE 10-6.25 MG PO TABS: 1.0000 | ORAL_TABLET | Freq: Every day | ORAL | 3 refills | Status: AC

## 2023-11-04 ENCOUNTER — Other Ambulatory Visit: Payer: Self-pay | Admitting: Family Medicine

## 2023-11-11 ENCOUNTER — Ambulatory Visit: Admitting: Family Medicine

## 2023-11-11 VITALS — BP 124/74 | HR 75 | Temp 98.1°F | Ht 68.5 in | Wt 151.0 lb

## 2023-11-11 DIAGNOSIS — I10 Essential (primary) hypertension: Secondary | ICD-10-CM | POA: Diagnosis not present

## 2023-11-11 DIAGNOSIS — Z7984 Long term (current) use of oral hypoglycemic drugs: Secondary | ICD-10-CM

## 2023-11-11 DIAGNOSIS — E785 Hyperlipidemia, unspecified: Secondary | ICD-10-CM | POA: Diagnosis not present

## 2023-11-11 DIAGNOSIS — E1159 Type 2 diabetes mellitus with other circulatory complications: Secondary | ICD-10-CM | POA: Diagnosis not present

## 2023-11-11 MED ORDER — TRULICITY 0.75 MG/0.5ML ~~LOC~~ SOAJ: 0.7500 mg | SUBCUTANEOUS | 1 refills | Status: DC

## 2023-11-11 NOTE — Patient Instructions (Signed)
Medication as prescribed.  Follow up in 1 month.  

## 2023-11-12 NOTE — Telephone Encounter (Signed)
--   called and left a message for pt - medical records request number is [phone]--   Copied from CRM [id number]. Topic: Medical Record Request - Records Request >> Nov 12, 2023 10:15 AM Oddis Bench wrote: Reason for CRM: Sheryle Donning wife is calling to see if she can have a list of patients medical records going back t0 2022 for his disability claim, advised can submit  med record release for the patient bc my chart does not have that option. Please contact patient [phone]

## 2023-11-14 NOTE — Assessment & Plan Note (Addendum)
 Uncontrolled. Adding Trulicity . Will monitor closely as patients BMI in 22. Referring to nutrition.

## 2023-11-14 NOTE — Assessment & Plan Note (Signed)
 LDL @ goal. Continue Lipitor.

## 2023-11-14 NOTE — Progress Notes (Signed)
 Subjective:  Patient ID: Victor Gonzales, male    DOB: 1960-10-16  Age: 63 y.o. MRN: [id number]  CC:   Chief Complaint  Patient presents with   Diabetes    Htn 3 month follow up    HPI:  63 year old male with the below mentioned medical problems presents for follow up.  HTN stable on Norvasc , Ziac , and Ramipril .  Lipids at goal (last LDL 28). Compliant with Crestor .  Diabetic control has been worsening. Last A1c 8.1. Patient feels that this is likely due to dietary indiscretion. Currently on Metformin , Farxiga , and Amaryl . Will discuss additional treatment today.  Patient Active Problem List   Diagnosis Date Noted   Depression, major, single episode, moderate (HCC) 11/12/2022   Atherosclerosis of aorta (HCC) 02/11/2022   Type 2 diabetes mellitus with other circulatory complications (HCC) 02/10/2022   ASCVD (arteriosclerotic cardiovascular disease) 11/26/2021   CVA (cerebral vascular accident) (HCC) 10/05/2021   Hyperlipidemia 07/21/2021   Fatty liver 03/25/2020   HTN (hypertension) 10/26/2012    Social Hx   Social History   Socioeconomic History   Marital status: Married    Spouse name: Not on file   Number of children: Not on file   Years of education: Not on file   Highest education level: Not on file  Occupational History   Not on file  Tobacco Use   Smoking status: Never   Smokeless tobacco: Never  Vaping Use   Vaping status: Never Used  Substance and Sexual Activity   Alcohol use: No   Drug use: No   Sexual activity: Not on file  Other Topics Concern   Not on file  Social History Narrative   Not on file   Social Drivers of Health   Financial Resource Strain: Not on file  Food Insecurity: Not on file  Transportation Needs: Not on file  Physical Activity: Not on file  Stress: Not on file  Social Connections: Not on file    Review of Systems  Constitutional: Negative.   Respiratory: Negative.    Cardiovascular: Negative.    Objective:  BP  124/74   Pulse 75   Temp 98.1 F (36.7 C)   Ht 5' 8.5 (1.74 m)   Wt 151 lb (68.5 kg)   SpO2 96%   BMI 22.63 kg/m      11/11/2023    1:41 PM 08/18/2023   10:42 AM 08/11/2023    2:54 PM  BP/Weight  Systolic BP 124 124 141  Diastolic BP 74 82 71  Wt. (Lbs) 151 148.8   BMI 22.63 kg/m2 22.3 kg/m2    Physical Exam Vitals and nursing note reviewed.  Constitutional:      General: He is not in acute distress.    Appearance: Normal appearance.  HENT:     Head: Normocephalic and atraumatic.   Cardiovascular:     Rate and Rhythm: Normal rate and regular rhythm.  Pulmonary:     Effort: Pulmonary effort is normal.     Breath sounds: Normal breath sounds.   Neurological:     Mental Status: He is alert.   Psychiatric:        Mood and Affect: Mood normal.        Behavior: Behavior normal.     Lab Results  Component Value Date   WBC 8.1 11/11/2022   HGB 17.2 11/11/2022   HCT 53.9 (H) 11/11/2022   PLT 197 11/11/2022   GLUCOSE 179 (H) 12/10/2022   CHOL 95 (L)  08/10/2022   TRIG 99 08/10/2022   HDL 48 08/10/2022   LDLCALC 28 08/10/2022   ALT 37 12/10/2022   AST 33 12/10/2022   NA 142 12/10/2022   K 4.3 12/10/2022   CL 100 12/10/2022   CREATININE 0.91 12/10/2022   BUN 14 12/10/2022   CO2 24 12/10/2022   PSA 0.31 05/31/2014   INR 1.0 10/05/2021   HGBA1C 8.1 (H) 08/16/2023   MICROALBUR 2.60 (H) 04/29/2013     Assessment & Plan:  Type 2 diabetes mellitus with other circulatory complications (HCC) Assessment & Plan: Uncontrolled. Adding Trulicity . Will monitor closely as patients BMI in 22. Referring to nutrition.  Orders: -     Trulicity ; Inject 0.75 mg into the skin once a week.  Dispense: 6 mL; Refill: 1 -     Amb ref to Medical Nutrition Therapy-MNT  Primary hypertension Assessment & Plan: Stable. Continue current medications.   Hyperlipidemia, unspecified hyperlipidemia type Assessment & Plan: LDL @ goal. Continue Lipitor.     Follow-up:  1  month.  Jacqulyn Ahle DO [facility]

## 2023-11-14 NOTE — Assessment & Plan Note (Signed)
 Stable.  Continue current medications.

## 2023-11-25 ENCOUNTER — Encounter: Attending: Family Medicine | Admitting: Nutrition

## 2023-11-25 VITALS — Ht 69.0 in | Wt 149.0 lb

## 2023-11-25 DIAGNOSIS — I6330 Cerebral infarction due to thrombosis of unspecified cerebral artery: Secondary | ICD-10-CM | POA: Insufficient documentation

## 2023-11-25 DIAGNOSIS — E1159 Type 2 diabetes mellitus with other circulatory complications: Secondary | ICD-10-CM | POA: Insufficient documentation

## 2023-11-25 NOTE — Patient Instructions (Addendum)
 Goals  Increase water  tp 4-5 bottles of water  per day Cut out snacks, and processed foods of nabs Increase whole plant based foods Eat meals on time Get in 35 grams of fiber per day See about a CGM Get A1C down to 7% or below Avoid foods higher than 200 mg of sodium per serving.

## 2023-11-25 NOTE — Progress Notes (Signed)
 Medical Nutrition Therapy  Appointment Start time:  1300  Appointment End time:  1400  Primary concerns today: DM Type 2  Referral diagnosis: E11.8  Preferred learning style: No Preference  (auditory, visual, hands on, no preference indicated) Learning readiness: Ready   NUTRITION ASSESSMENT  63 yr old wmale referred for uncontrolled type 2 DM. Has had DM for 20 yrs Had a stroke in 2023 from blockage in brain. No issues with chewing or swallowing or walking.A1C was 8.1%, down from 8.6%. PCP Dr. Bluford. FBS 103 mg, night 133 mg/dl.  Just started Trucliity 2 days ago. Is on Farxiga , Metformin  1000 mg BID . He has already begun to see a difference in his blood sugars with the Trulicity ..  Would benefit from a CGM He is willing to make some lifestyle changes to improve his health and his DM.  Clinical Medical Hx:  Past Medical History:  Diagnosis Date   Anxiety    Cancer [facility])    Skin   Essential hypertension    GERD (gastroesophageal reflux disease)    Hepatic steatosis    History of stroke    May 2023   Hyperlipidemia    Type 2 diabetes mellitus (HCC)    . Medications:  Current Outpatient Medications on File Prior to Visit  Medication Sig Dispense Refill   amLODipine  (NORVASC ) 10 MG tablet Take 1 tablet (10 mg total) by mouth daily. 90 tablet 3   ASHWAGANDHA PO Take by mouth.     aspirin  81 MG tablet Take 1 tablet (81 mg total) by mouth daily. Takes one every other day 30 tablet 3   bisoprolol -hydrochlorothiazide  (ZIAC ) 10-6.25 MG tablet Take 1 tablet by mouth daily. 90 tablet 3   dapagliflozin  propanediol (FARXIGA ) 10 MG TABS tablet Take 10 mg by mouth daily.     Dulaglutide  (TRULICITY ) 0.75 MG/0.5ML SOAJ Inject 0.75 mg into the skin once a week. 6 mL 1   glimepiride  (AMARYL ) 4 MG tablet TAKE 1 TABLET TWICE A DAY 180 tablet 1   MAGNESIUM  PO Take by mouth daily.     metFORMIN  (GLUCOPHAGE ) 500 MG tablet TAKE 2 TABLETS (1000MG ) TWOTIMES A DAY 360 tablet 1   Multiple Vitamin  (MULTIVITAMIN) tablet Take 1 tablet by mouth daily.     ramipril  (ALTACE ) 10 MG capsule TAKE 2 CAPSULES DAILY 180 capsule 3   rosuvastatin  (CRESTOR ) 10 MG tablet Take 1 tablet (10 mg total) by mouth daily. 90 tablet 3   topiramate  (TOPAMAX ) 25 MG tablet Take 1 tablet (25 mg total) by mouth 2 (two) times daily. 120 tablet 3   Accu-Chek FastClix Lancets MISC Use to check blood sugar once a day Dx: E11.9 100 each 1   baclofen  (LIORESAL ) 10 MG tablet Take 0.5-1 tablets (5-10 mg total) by mouth 3 (three) times daily as needed for muscle spasms. 30 each 0   Glucose Blood (BLOOD GLUCOSE TEST STRIPS 333) STRP 1 Device by Other route daily. 100 strip 2   MISC NATURAL PRODUCTS PO Take 1 capsule by mouth daily. Liver Well     No current facility-administered medications on file prior to visit.    Labs: SABRA Notable Signs/Symptoms: gets light headed at time in middle of night  Lifestyle & Dietary Hx Lives with wife. On disability from CVA.  Estimated daily fluid intake: 40 oz Supplements: Mg, Ashwagonda Sleep:  Stress / self-care:  Current average weekly physical activity: ADL  24-Hr Dietary Recall First Meal: skips usually Snack:  Second Meal: nabs, water  or diet  sodas, or regular soda or tea Snack:  Third Meal: Squash, zucchini, asparagus and chicken Snack:  Beverages: water , soda, tea or diet.  Estimated Energy Needs Calories: 2000 Carbohydrate: 225g Protein: 150g Fat: 56g   NUTRITION DIAGNOSIS  NB-1.1 Food and nutrition-related knowledge deficit As related to excessive carb, fast and sugar intake.  As evidenced by A1C 8.1%. .   NUTRITION INTERVENTION  Nutrition education (E-1) on the following topics:  Nutrition and Diabetes education provided on My Plate, CHO counting, meal planning, portion sizes, timing of meals, avoiding snacks between meals unless having a low blood sugar, target ranges for A1C and blood sugars, signs/symptoms and treatment of hyper/hypoglycemia, monitoring  blood sugars, taking medications as prescribed, benefits of exercising 30 minutes per day and prevention of complications of DM.  Lifestyle Medicine  - Whole Food, Plant Predominant Nutrition is highly recommended: Eat Plenty of vegetables, Mushrooms, fruits, Legumes, Whole Grains, Nuts, seeds in lieu of processed meats, processed snacks/pastries red meat, poultry, eggs.    -It is better to avoid simple carbohydrates including: Cakes, Sweet Desserts, Ice Cream, Soda (diet and regular), Sweet Tea, Candies, Chips, Cookies, Store Bought Juices, Alcohol in Excess of  1-2 drinks a day, Lemonade,  Artificial Sweeteners, Doughnuts, Coffee Creamers, Sugar-free Products, etc, etc.  This is not a complete list.....  Exercise: If you are able: 30 -60 minutes a day ,4 days a week, or 150 minutes a week.  The longer the better.  Combine stretch, strength, and aerobic activities.  If you were told in the past that you have high risk for cardiovascular diseases, you may seek evaluation by your heart doctor prior to initiating moderate to intense exercise programs.   Handouts Provided Include  Know your numbers Lifestyle Medicine  Learning Style & Readiness for Change Teaching method utilized: Visual & Auditory  Demonstrated degree of understanding via: Teach Back  Barriers to learning/adherence to lifestyle change:    Goals Established by Pt Goals  Increase water  tp 4-5 bottles of water  per day Cut out snacks, and processed foods of nabs Increase whole plant based foods Eat meals on time Get in 35 grams of fiber per day See about a CGM Get A1C down to 7% or below Avoid foods higher than 200 mg of sodium per serving.  MONITORING & EVALUATION Dietary intake, weekly physical activity, and blood sugars in 1 month.  Next Steps  Patient is to work on eating better consistent meals from whole plant based foods.SABRA

## 2023-11-26 ENCOUNTER — Encounter: Payer: Self-pay | Admitting: Family Medicine

## 2023-11-28 ENCOUNTER — Encounter: Payer: Self-pay | Admitting: Family Medicine

## 2023-11-29 ENCOUNTER — Telehealth: Payer: Self-pay | Admitting: Family Medicine

## 2023-11-29 ENCOUNTER — Other Ambulatory Visit: Payer: Self-pay | Admitting: Family Medicine

## 2023-11-29 ENCOUNTER — Other Ambulatory Visit: Payer: Self-pay

## 2023-11-29 DIAGNOSIS — Z125 Encounter for screening for malignant neoplasm of prostate: Secondary | ICD-10-CM

## 2023-11-29 DIAGNOSIS — Z79899 Other long term (current) drug therapy: Secondary | ICD-10-CM

## 2023-11-29 DIAGNOSIS — E1159 Type 2 diabetes mellitus with other circulatory complications: Secondary | ICD-10-CM

## 2023-11-29 DIAGNOSIS — I10 Essential (primary) hypertension: Secondary | ICD-10-CM

## 2023-11-29 DIAGNOSIS — E785 Hyperlipidemia, unspecified: Secondary | ICD-10-CM

## 2023-11-29 DIAGNOSIS — E1140 Type 2 diabetes mellitus with diabetic neuropathy, unspecified: Secondary | ICD-10-CM

## 2023-11-29 MED ORDER — GLIMEPIRIDE 2 MG PO TABS: 2.0000 mg | ORAL_TABLET | Freq: Two times a day (BID) | ORAL | 5 refills | Status: DC

## 2023-11-29 NOTE — Telephone Encounter (Signed)
 Nurses Please call [facility] I sent them a updated prescription on Amaryl  2 mg instead of 4 mg-patient now relates that he is having frequent low sugars-please cancel this.  Also cancel the other prescription of Amaryl  Please take Amaryl  off of his medication list  It is possible we will resume this medicine again in the future but for now it needs to be stopped.  Please call patient let him know this as well plus also I sent him a detailed MyChart message thank you-Dr. Glendia

## 2023-11-29 NOTE — Telephone Encounter (Signed)
 Called both numbers and spoke with patient and wife as well as [facility] d/c amaryl  and have  labs done and follow up w/ provider, pt verbalized understanding.

## 2023-11-29 NOTE — Telephone Encounter (Signed)
 Labs ordered see telephone note

## 2023-11-29 NOTE — Telephone Encounter (Signed)
 Nurses Please order metabolic 7, lipid, liver, urine ACR, A1c, PSA  Screening prostate cancer, hypertension, hyperlipidemia, high risk medication, type 2 diabetes  When you call the patient with the other telephone message sure he knows to do his lab work this week thank you

## 2023-11-30 ENCOUNTER — Encounter: Payer: Self-pay | Admitting: Nutrition

## 2023-12-02 ENCOUNTER — Ambulatory Visit: Payer: Self-pay | Admitting: Family Medicine

## 2023-12-02 LAB — PSA: Prostate Specific Ag, Serum: 0.4 ng/mL (ref 0.0–4.0)

## 2023-12-02 LAB — BASIC METABOLIC PANEL WITH GFR
BUN/Creatinine Ratio: 22 (ref 10–24)
BUN: 19 mg/dL (ref 8–27)
CO2: 21 mmol/L (ref 20–29)
Calcium: 10.3 mg/dL — ABNORMAL HIGH (ref 8.6–10.2)
Chloride: 98 mmol/L (ref 96–106)
Creatinine, Ser: 0.86 mg/dL (ref 0.76–1.27)
Glucose: 132 mg/dL — ABNORMAL HIGH (ref 70–99)
Potassium: 4.4 mmol/L (ref 3.5–5.2)
Sodium: 140 mmol/L (ref 134–144)
eGFR: 97 mL/min/1.73 (ref >59)

## 2023-12-02 LAB — LIPID PANEL
Chol/HDL Ratio: 3 ratio (ref 0.0–5.0)
Cholesterol, Total: 132 mg/dL (ref 100–199)
HDL: 44 mg/dL (ref >39)
LDL Chol Calc (NIH): 68 mg/dL (ref 0–99)
Triglycerides: 112 mg/dL (ref 0–149)
VLDL Cholesterol Cal: 20 mg/dL (ref 5–40)

## 2023-12-02 LAB — HEPATIC FUNCTION PANEL
ALT: 42 IU/L (ref 0–44)
AST: 33 IU/L (ref 0–40)
Albumin: 4.8 g/dL (ref 3.9–4.9)
Alkaline Phosphatase: 67 IU/L (ref 44–121)
Bilirubin Total: 0.9 mg/dL (ref 0.0–1.2)
Bilirubin, Direct: 0.25 mg/dL (ref 0.00–0.40)
Total Protein: 7.3 g/dL (ref 6.0–8.5)

## 2023-12-02 LAB — MICROALBUMIN / CREATININE URINE RATIO
Creatinine, Urine: 87.2 mg/dL
Microalb/Creat Ratio: 24 mg/g{creat} (ref 0–29)
Microalbumin, Urine: 20.9 ug/mL

## 2023-12-02 LAB — HEMOGLOBIN A1C
Est. average glucose Bld gHb Est-mCnc: 154 mg/dL
Hgb A1c MFr Bld: 7 % — ABNORMAL HIGH (ref 4.8–5.6)

## 2023-12-06 ENCOUNTER — Other Ambulatory Visit: Payer: Self-pay | Admitting: Family Medicine

## 2023-12-06 ENCOUNTER — Encounter: Payer: Self-pay | Admitting: Family Medicine

## 2023-12-13 ENCOUNTER — Ambulatory Visit: Admitting: Family Medicine

## 2023-12-16 ENCOUNTER — Ambulatory Visit: Admitting: Family Medicine

## 2023-12-17 ENCOUNTER — Telehealth: Payer: Self-pay

## 2023-12-17 NOTE — Telephone Encounter (Signed)
 Communication  Reason for CRM: Roselie w/ [facility]  Med following up on fax for continuous glucose monitor. Fax was sent on 12/13/23. Will refax it to office and follow up on monday.

## 2023-12-22 ENCOUNTER — Encounter: Payer: Self-pay | Admitting: Cardiology

## 2023-12-22 ENCOUNTER — Ambulatory Visit: Attending: Cardiology | Admitting: Cardiology

## 2023-12-22 VITALS — BP 136/76 | HR 77 | Ht 68.5 in | Wt 150.0 lb

## 2023-12-22 DIAGNOSIS — I10 Essential (primary) hypertension: Secondary | ICD-10-CM | POA: Diagnosis not present

## 2023-12-22 DIAGNOSIS — I2510 Atherosclerotic heart disease of native coronary artery without angina pectoris: Secondary | ICD-10-CM | POA: Diagnosis not present

## 2023-12-22 DIAGNOSIS — Z8673 Personal history of transient ischemic attack (TIA), and cerebral infarction without residual deficits: Secondary | ICD-10-CM | POA: Diagnosis not present

## 2023-12-22 DIAGNOSIS — E782 Mixed hyperlipidemia: Secondary | ICD-10-CM | POA: Diagnosis not present

## 2023-12-22 NOTE — Progress Notes (Signed)
    Cardiology Office Note  Date: 12/22/2023   ID: DONTARIUS SHELEY, DOB 1960-06-13, MRN [id number]  History of Present Illness: Victor Gonzales is a 63 y.o. male last seen in May 2024.  He is here today with his wife for a follow-up visit.  He does not report any exertional chest pain or unusual shortness of breath with typical activities.  He continues to follow with Dr. Bluford for primary care.  We went over his medications.  He reports compliance with current regimen.  Remains on aspirin  80 mg daily, Jardiance  10 mg daily, Crestor  10 mg daily, and antihypertensive regimen as before.  Lipid panel recently showed LDL 68.  I reviewed his ECG today which shows sinus rhythm with rightward axis.  Physical Exam: VS:  BP 136/76 (BP Location: Left Arm, Cuff Size: Normal)   Pulse 77   Ht 5' 8.5 (1.74 m)   Wt 150 lb (68 kg)   SpO2 95%   BMI 22.48 kg/m , BMI Body mass index is 22.48 kg/m.  Wt Readings from Last 3 Encounters:  12/22/23 150 lb (68 kg)  11/25/23 149 lb (67.6 kg)  11/11/23 151 lb (68.5 kg)    General: Patient appears comfortable at rest. HEENT: Conjunctiva and lids normal. Neck: Supple, no elevated JVP or carotid bruits. Lungs: Clear to auscultation, nonlabored breathing at rest. Cardiac: Regular rate and rhythm, no S3 or significant systolic murmur.  ECG:  An ECG dated 10/06/2022 was personally reviewed today and demonstrated:  Sinus rhythm with rightward axis, Q-waves in the inferior leads, nonspecific ST changes.  Labwork: 12/01/2023: ALT 42; AST 33; BUN 19; Creatinine, Ser 0.86; Potassium 4.4; Sodium 140     Component Value Date/Time   CHOL 132 12/01/2023 0933   TRIG 112 12/01/2023 0933   HDL 44 12/01/2023 0933   CHOLHDL 3.0 12/01/2023 0933   CHOLHDL 2.2 10/06/2021 0433   VLDL 15 10/06/2021 0433   LDLCALC 68 12/01/2023 0933   Other Studies Reviewed Today:  No interval cardiac testing for review today.  Assessment and Plan:  1.  Atherosclerosis evident by CT  imaging.  Lexiscan  Myoview  in June 2024 was negative for ischemia with LVEF 62%.  ECG reviewed.  Plan to continue observation in the absence of angina.  Continue aspirin  80 mg daily, Jardiance  10 mg daily, and Crestor  10 mg daily.  2.  Primary hypertension.  No change in current regimen which includes Altace  10 mg daily, Ziac  10/6.25 mg daily and Norvasc  10 mg daily   3.  Mixed hyperlipidemia, recent LDL 68.  Continue Crestor  10 mg daily.   4.  History of stroke in May 2023, acute left basal ganglia perforator infarct treated with antiplatelet therapy.  No atrial fibrillation documented.  Disposition:  Follow up 1 year.  Signed, Jayson JUDITHANN Sierras, M.D., F.A.C.C.  [facility]

## 2023-12-22 NOTE — Patient Instructions (Signed)
Medication Instructions:  Your physician recommends that you continue on your current medications as directed. Please refer to the Current Medication list given to you today.   Labwork: None today  Testing/Procedures: None today  Follow-Up: 1 year Dr.McDowell  Any Other Special Instructions Will Be Listed Below (If Applicable).  If you need a refill on your cardiac medications before your next appointment, please call your pharmacy.

## 2024-01-03 ENCOUNTER — Encounter: Payer: Self-pay | Admitting: Nutrition

## 2024-01-03 ENCOUNTER — Ambulatory Visit: Admitting: Family Medicine

## 2024-01-03 ENCOUNTER — Encounter: Attending: Family Medicine | Admitting: Nutrition

## 2024-01-03 VITALS — Ht 69.0 in | Wt 148.0 lb

## 2024-01-03 VITALS — BP 133/72 | HR 72 | Temp 98.1°F | Ht 69.6 in | Wt 148.8 lb

## 2024-01-03 DIAGNOSIS — I10 Essential (primary) hypertension: Secondary | ICD-10-CM | POA: Insufficient documentation

## 2024-01-03 DIAGNOSIS — E1159 Type 2 diabetes mellitus with other circulatory complications: Secondary | ICD-10-CM | POA: Insufficient documentation

## 2024-01-03 DIAGNOSIS — E782 Mixed hyperlipidemia: Secondary | ICD-10-CM | POA: Insufficient documentation

## 2024-01-03 DIAGNOSIS — E785 Hyperlipidemia, unspecified: Secondary | ICD-10-CM | POA: Diagnosis not present

## 2024-01-03 DIAGNOSIS — I6330 Cerebral infarction due to thrombosis of unspecified cerebral artery: Secondary | ICD-10-CM | POA: Diagnosis present

## 2024-01-03 DIAGNOSIS — Z7984 Long term (current) use of oral hypoglycemic drugs: Secondary | ICD-10-CM | POA: Diagnosis not present

## 2024-01-03 MED ORDER — FREESTYLE LIBRE 3 PLUS SENSOR MISC: 3 refills | Status: DC

## 2024-01-03 MED ORDER — METFORMIN HCL ER 750 MG PO TB24: 750.0000 mg | ORAL_TABLET | Freq: Two times a day (BID) | ORAL | 3 refills | Status: AC

## 2024-01-03 MED ORDER — FREESTYLE LIBRE 3 PLUS SENSOR MISC: 3 refills | Status: AC

## 2024-01-03 NOTE — Patient Instructions (Signed)
 Keep up the great job! Avoid Keto ice cream and keto products due to high fat content. May consider SF popcycle or fruit bar instead. Increase water  intake Keep eating dried beans, peas, lentils, whole vegetables from garden. Get A1C to 6.5% or below.

## 2024-01-03 NOTE — Patient Instructions (Signed)
Follow up in 3 months.  Take care  Dr. Cook  

## 2024-01-03 NOTE — Progress Notes (Signed)
 Medical Nutrition Therapy  Appointment Start time: 1630 Appointment End time:  1700  Primary concerns today: DM Type 2  Referral diagnosis: E11.8  Preferred learning style: No Preference  (auditory, visual, hands on, no preference indicated) Learning readiness: Ready   NUTRITION ASSESSMENT  Saw Dr. Bluford. A1C down to 7%. Feels much better. Still on Trulicity . Is on Jardiance  also. BS are much improved. He has cut out sodas and drinks mostly water  now, with some occassional sweet tea when eating out Has a CGM and has found it extrememly helpful to see what foods he eats makes it go up too high to make better choices. Has cut out processed foods and eating more foods from garden. Stays active.  Changed to XR Metformin  due to GI side effects.    Goals set previously: increase water  tp 4-5 bottles of water  per day-improved Cut out snacks, and processed foods of nabs-done Increase whole plant based foods-done Eat meals on time-done Get in 35 grams of fiber per day-working on it See about a CGM- working with insurance Get A1C down to 7% or below-- A1C 7.0! Avoid foods higher than 200 mg of sodium per serving.-done   Lab Results  Component Value Date   HGBA1C 7.0 (H) 12/01/2023      Latest Ref Rng & Units 12/01/2023    9:33 AM 12/10/2022    8:11 AM 11/11/2022    2:29 PM  CMP  Glucose 70 - 99 mg/dL 867  820  866   BUN 8 - 27 mg/dL 19  14  14    Creatinine 0.76 - 1.27 mg/dL 9.13  9.08  9.00   Sodium 134 - 144 mmol/L 140  142  142   Potassium 3.5 - 5.2 mmol/L 4.4  4.3  5.4   Chloride 96 - 106 mmol/L 98  100  102   CO2 20 - 29 mmol/L 21  24  24    Calcium  8.6 - 10.2 mg/dL 89.6  9.7  89.1   Total Protein 6.0 - 8.5 g/dL 7.3  7.7  7.4   Total Bilirubin 0.0 - 1.2 mg/dL 0.9  1.0  0.9   Alkaline Phos 44 - 121 IU/L 67  68  69   AST 0 - 40 IU/L 33  33  46   ALT 0 - 44 IU/L 42  37  44    Lipid Panel     Component Value Date/Time   CHOL 132 12/01/2023 0933   TRIG 112 12/01/2023 0933   HDL  44 12/01/2023 0933   CHOLHDL 3.0 12/01/2023 0933   CHOLHDL 2.2 10/06/2021 0433   VLDL 15 10/06/2021 0433   LDLCALC 68 12/01/2023 0933   LABVLDL 20 12/01/2023 0933    Clinical Medical Hx:  Past Medical History:  Diagnosis Date   Anxiety    Cancer (HCC)    Skin   Essential hypertension    GERD (gastroesophageal reflux disease)    Hepatic steatosis    History of stroke    May 2023   Hyperlipidemia    Type 2 diabetes mellitus (HCC)    . Medications:  Current Outpatient Medications on File Prior to Visit  Medication Sig Dispense Refill   Accu-Chek FastClix Lancets MISC Use to check blood sugar once a day Dx: E11.9 100 each 1   amLODipine  (NORVASC ) 10 MG tablet Take 1 tablet (10 mg total) by mouth daily. 90 tablet 3   ASHWAGANDHA PO Take by mouth.     aspirin  81 MG tablet Take  1 tablet (81 mg total) by mouth daily. Takes one every other day 30 tablet 3   baclofen  (LIORESAL ) 10 MG tablet Take 0.5-1 tablets (5-10 mg total) by mouth 3 (three) times daily as needed for muscle spasms. 30 each 0   bisoprolol -hydrochlorothiazide  (ZIAC ) 10-6.25 MG tablet Take 1 tablet by mouth daily. 90 tablet 3   dapagliflozin  propanediol (FARXIGA ) 10 MG TABS tablet Take 10 mg by mouth daily.     Dulaglutide  (TRULICITY ) 0.75 MG/0.5ML SOAJ Inject 0.75 mg into the skin once a week. 6 mL 1   Glucose Blood (BLOOD GLUCOSE TEST STRIPS 333) STRP 1 Device by Other route daily. 100 strip 2   JARDIANCE  10 MG TABS tablet Take 10 mg by mouth every morning.     MAGNESIUM  PO Take by mouth daily.     MISC NATURAL PRODUCTS PO Take 1 capsule by mouth daily. Liver Well     Multiple Vitamin (MULTIVITAMIN) tablet Take 1 tablet by mouth daily.     ramipril  (ALTACE ) 10 MG capsule TAKE 2 CAPSULES DAILY 180 capsule 3   rosuvastatin  (CRESTOR ) 10 MG tablet Take 1 tablet (10 mg total) by mouth daily. 90 tablet 3   topiramate  (TOPAMAX ) 25 MG tablet Take 1 tablet (25 mg total) by mouth 2 (two) times daily. 120 tablet 3   No  current facility-administered medications on file prior to visit.   Wt Readings from Last 3 Encounters:  01/03/24 148 lb 12.8 oz (67.5 kg)  12/22/23 150 lb (68 kg)  11/25/23 149 lb (67.6 kg)   Ht Readings from Last 3 Encounters:  01/03/24 5' 9.6 (1.768 m)  12/22/23 5' 8.5 (1.74 m)  11/25/23 5' 9 (1.753 m)   There is no height or weight on file to calculate BMI. @BMIFA @ Facility age limit for growth %iles is 20 years. Facility age limit for growth %iles is 20 years.   Labs: SABRA Notable Signs/Symptoms: gets light headed at time in middle of night  Lifestyle & Dietary Hx Lives with wife. On disability from CVA.  Estimated daily fluid intake: 40 oz Supplements: Mg, Ashwagonda Sleep:  Stress / self-care:  Current average weekly physical activity: ADL  24-Hr Dietary Recall Eating 3 better balanced meals. More vegetables and fruit from garden. Eating more home cooked meals. Drinking more water . Limiting Dt soda some.   Estimated Energy Needs Calories: 2000 Carbohydrate: 225g Protein: 150g Fat: 56g   NUTRITION DIAGNOSIS  NB-1.1 Food and nutrition-related knowledge deficit As related to excessive carb, fast and sugar intake.  As evidenced by A1C 8.1%. .   NUTRITION INTERVENTION  Nutrition education (E-1) on the following topics:  Nutrition and Diabetes education provided on My Plate, CHO counting, meal planning, portion sizes, timing of meals, avoiding snacks between meals unless having a low blood sugar, target ranges for A1C and blood sugars, signs/symptoms and treatment of hyper/hypoglycemia, monitoring blood sugars, taking medications as prescribed, benefits of exercising 30 minutes per day and prevention of complications of DM.  Lifestyle Medicine  - Whole Food, Plant Predominant Nutrition is highly recommended: Eat Plenty of vegetables, Mushrooms, fruits, Legumes, Whole Grains, Nuts, seeds in lieu of processed meats, processed snacks/pastries red meat, poultry, eggs.     -It is better to avoid simple carbohydrates including: Cakes, Sweet Desserts, Ice Cream, Soda (diet and regular), Sweet Tea, Candies, Chips, Cookies, Store Bought Juices, Alcohol in Excess of  1-2 drinks a day, Lemonade,  Artificial Sweeteners, Doughnuts, Coffee Creamers, Sugar-free Products, etc, etc.  This is not a  complete list.....  Exercise: If you are able: 30 -60 minutes a day ,4 days a week, or 150 minutes a week.  The longer the better.  Combine stretch, strength, and aerobic activities.  If you were told in the past that you have high risk for cardiovascular diseases, you may seek evaluation by your heart doctor prior to initiating moderate to intense exercise programs.   Handouts Provided Include  Know your numbers Lifestyle Medicine  Learning Style & Readiness for Change Teaching method utilized: Visual & Auditory  Demonstrated degree of understanding via: Teach Back  Barriers to learning/adherence to lifestyle change:    Goals Established by Pt  Keep up the great job! Avoid Keto ice cream and keto products due to high fat content. May consider SF popcycle or fruit bar instead. Increase water  intake Keep eating dried beans, peas, lentils, whole vegetables from garden. Get A1C to 6.5% or below. MONITORING & EVALUATION Dietary intake, weekly physical activity, and blood sugars in 3 months.  Next Steps  Patient is to work on eating better consistent meals from whole plant based foods.SABRA

## 2024-01-04 NOTE — Assessment & Plan Note (Signed)
LDL at goal on Crestor.  Continue.

## 2024-01-04 NOTE — Assessment & Plan Note (Signed)
 A1c now at goal.  He is having side effects from metformin .  Switching to metformin  XR.  Continue Jardiance  and Trulicity .

## 2024-01-04 NOTE — Assessment & Plan Note (Signed)
 BP stable.  Continue bisoprolol /HCTZ, ramipril , and amlodipine .

## 2024-01-04 NOTE — Progress Notes (Signed)
 Subjective:  Patient ID: Victor Gonzales, male    DOB: 06-18-1960  Age: 63 y.o. MRN: [id number]  CC: Follow-up  HPI:  63 year old male with the below mentioned medical problems presents for follow-up.  Patient states that overall he is doing well.  However, he does note that he is experiencing side effects of metformin .  He is having diarrhea and has trouble getting to the restroom sometimes.  Will discuss change in therapy today.  He is A1c is now at goal.  He is on metformin , Jardiance , and Trulicity .  LDL at goal on Crestor .  BP well-controlled/stable on bisoprolol /HCTZ, amlodipine , and ramipril .  Patient Active Problem List   Diagnosis Date Noted   Depression, major, single episode, moderate (HCC) 11/12/2022   Atherosclerosis of aorta (HCC) 02/11/2022   Type 2 diabetes mellitus with other circulatory complications (HCC) 02/10/2022   ASCVD (arteriosclerotic cardiovascular disease) 11/26/2021   CVA (cerebral vascular accident) (HCC) 10/05/2021   Hyperlipidemia 07/21/2021   Fatty liver 03/25/2020   HTN (hypertension) 10/26/2012    Social Hx   Social History   Socioeconomic History   Marital status: Married    Spouse name: Not on file   Number of children: Not on file   Years of education: Not on file   Highest education level: Not on file  Occupational History   Not on file  Tobacco Use   Smoking status: Never   Smokeless tobacco: Never  Vaping Use   Vaping status: Never Used  Substance and Sexual Activity   Alcohol use: No   Drug use: No   Sexual activity: Not on file  Other Topics Concern   Not on file  Social History Narrative   Not on file   Social Drivers of Health   Financial Resource Strain: Not on file  Food Insecurity: Not on file  Transportation Needs: Not on file  Physical Activity: Not on file  Stress: Not on file  Social Connections: Not on file    Review of Systems Per HPI  Objective:  BP 133/72   Pulse 72   Temp 98.1 F (36.7 C)    Ht 5' 9.6 (1.768 m)   Wt 148 lb 12.8 oz (67.5 kg)   SpO2 97%   BMI 21.60 kg/m      01/03/2024    5:24 PM 01/03/2024    3:46 PM 01/03/2024    3:40 PM  BP/Weight  Systolic BP  133 851  Diastolic BP  72 76  Wt. (Lbs) 148  148.8  BMI 21.86 kg/m2  21.6 kg/m2    Physical Exam Vitals and nursing note reviewed.  Constitutional:      General: He is not in acute distress.    Appearance: Normal appearance.  HENT:     Head: Normocephalic and atraumatic.  Eyes:     General:        Right eye: No discharge.        Left eye: No discharge.     Conjunctiva/sclera: Conjunctivae normal.  Cardiovascular:     Rate and Rhythm: Normal rate and regular rhythm.  Pulmonary:     Effort: Pulmonary effort is normal.     Breath sounds: Normal breath sounds. No wheezing, rhonchi or rales.  Neurological:     Mental Status: He is alert.  Psychiatric:        Mood and Affect: Mood normal.        Behavior: Behavior normal.     Lab Results  Component Value Date  WBC 8.1 11/11/2022   HGB 17.2 11/11/2022   HCT 53.9 (H) 11/11/2022   PLT 197 11/11/2022   GLUCOSE 132 (H) 12/01/2023   CHOL 132 12/01/2023   TRIG 112 12/01/2023   HDL 44 12/01/2023   LDLCALC 68 12/01/2023   ALT 42 12/01/2023   AST 33 12/01/2023   NA 140 12/01/2023   K 4.4 12/01/2023   CL 98 12/01/2023   CREATININE 0.86 12/01/2023   BUN 19 12/01/2023   CO2 21 12/01/2023   PSA 0.31 05/31/2014   INR 1.0 10/05/2021   HGBA1C 7.0 (H) 12/01/2023   MICROALBUR 2.60 (H) 04/29/2013     Assessment & Plan:  Type 2 diabetes mellitus with other circulatory complications (HCC) Assessment & Plan: A1c now at goal.  He is having side effects from metformin .  Switching to metformin  XR.  Continue Jardiance  and Trulicity .  Orders: -     metFORMIN  HCl ER; Take 1 tablet (750 mg total) by mouth in the morning and at bedtime.  Dispense: 180 tablet; Refill: 3 -     FreeStyle Libre 3 Plus Sensor; Use to check blood sugar continuously. Change sensor  every 15 days.  Dispense: 6 each; Refill: 3  Primary hypertension Assessment & Plan: BP stable.  Continue bisoprolol /HCTZ, ramipril , and amlodipine .   Hyperlipidemia, unspecified hyperlipidemia type Assessment & Plan: LDL at goal on Crestor .  Continue.     Follow-up: 3 months  Cova Knieriem Bluford DO [facility]

## 2024-01-16 ENCOUNTER — Other Ambulatory Visit: Payer: Self-pay | Admitting: Family Medicine

## 2024-04-03 DIAGNOSIS — C44300 Unspecified malignant neoplasm of skin of unspecified part of face: Secondary | ICD-10-CM | POA: Insufficient documentation

## 2024-04-04 ENCOUNTER — Ambulatory Visit: Admitting: Family Medicine

## 2024-04-04 ENCOUNTER — Encounter: Attending: Family Medicine | Admitting: Nutrition

## 2024-04-04 VITALS — BP 135/78 | HR 85 | Temp 98.2°F | Resp 18 | Ht 69.0 in | Wt 151.0 lb

## 2024-04-04 VITALS — Ht 68.5 in | Wt 151.0 lb

## 2024-04-04 DIAGNOSIS — E1159 Type 2 diabetes mellitus with other circulatory complications: Secondary | ICD-10-CM | POA: Diagnosis present

## 2024-04-04 DIAGNOSIS — I10 Essential (primary) hypertension: Secondary | ICD-10-CM | POA: Diagnosis not present

## 2024-04-04 DIAGNOSIS — E785 Hyperlipidemia, unspecified: Secondary | ICD-10-CM | POA: Diagnosis not present

## 2024-04-04 DIAGNOSIS — Z7984 Long term (current) use of oral hypoglycemic drugs: Secondary | ICD-10-CM

## 2024-04-04 DIAGNOSIS — Z8673 Personal history of transient ischemic attack (TIA), and cerebral infarction without residual deficits: Secondary | ICD-10-CM

## 2024-04-04 DIAGNOSIS — Z7985 Long-term (current) use of injectable non-insulin antidiabetic drugs: Secondary | ICD-10-CM

## 2024-04-04 NOTE — Progress Notes (Signed)
 Medical Nutrition Therapy  Appointment Start time: 1415 Appointment End time:  1700  Primary concerns today: DM Type 2  Referral diagnosis: E11.8  Preferred learning style: No Preference  (auditory, visual, hands on, no preference indicated) Learning readiness: Ready   NUTRITION ASSESSMENT  2 month follow up DM Changes made in the last 2 months; Drinking more protein shakes, eating more chicken, turkey. Cut down on processed foods. Eating more fruits and vegetables. Had been doing a lot of keto foods and has stopped since last visit. Still on Trulicity . Appetite is less but eating better quality foods Walking for exercise.. Just got blood work done and saw Dr. Bluford today Less pain and numbness in foot and has less swelling in feet. PCP Dr. Bluford  Lab Results  Component Value Date   HGBA1C 7.0 (H) 12/01/2023      Latest Ref Rng & Units 12/01/2023    9:33 AM 12/10/2022    8:11 AM 11/11/2022    2:29 PM  CMP  Glucose 70 - 99 mg/dL 867  820  866   BUN 8 - 27 mg/dL 19  14  14    Creatinine 0.76 - 1.27 mg/dL 9.13  9.08  9.00   Sodium 134 - 144 mmol/L 140  142  142   Potassium 3.5 - 5.2 mmol/L 4.4  4.3  5.4   Chloride 96 - 106 mmol/L 98  100  102   CO2 20 - 29 mmol/L 21  24  24    Calcium  8.6 - 10.2 mg/dL 89.6  9.7  89.1   Total Protein 6.0 - 8.5 g/dL 7.3  7.7  7.4   Total Bilirubin 0.0 - 1.2 mg/dL 0.9  1.0  0.9   Alkaline Phos 44 - 121 IU/L 67  68  69   AST 0 - 40 IU/L 33  33  46   ALT 0 - 44 IU/L 42  37  44    Lipid Panel     Component Value Date/Time   CHOL 132 12/01/2023 0933   TRIG 112 12/01/2023 0933   HDL 44 12/01/2023 0933   CHOLHDL 3.0 12/01/2023 0933   CHOLHDL 2.2 10/06/2021 0433   VLDL 15 10/06/2021 0433   LDLCALC 68 12/01/2023 0933   LABVLDL 20 12/01/2023 0933    Clinical Medical Hx:  Past Medical History:  Diagnosis Date   Anxiety    Cancer (HCC)    Skin   Essential hypertension    GERD (gastroesophageal reflux disease)    Hepatic steatosis    History  of stroke    May 2023   Hyperlipidemia    Type 2 diabetes mellitus (HCC)    . Medications:  Current Outpatient Medications on File Prior to Visit  Medication Sig Dispense Refill   Accu-Chek FastClix Lancets MISC Use to check blood sugar once a day Dx: E11.9 100 each 1   amLODipine  (NORVASC ) 10 MG tablet Take 1 tablet (10 mg total) by mouth daily. 90 tablet 3   ASHWAGANDHA PO Take by mouth.     aspirin  81 MG tablet Take 1 tablet (81 mg total) by mouth daily. Takes one every other day 30 tablet 3   baclofen  (LIORESAL ) 10 MG tablet Take 0.5-1 tablets (5-10 mg total) by mouth 3 (three) times daily as needed for muscle spasms. 30 each 0   bisoprolol -hydrochlorothiazide  (ZIAC ) 10-6.25 MG tablet Take 1 tablet by mouth daily. 90 tablet 3   Continuous Glucose Sensor (FREESTYLE LIBRE 3 PLUS SENSOR) MISC Use to check  blood sugar continuously. Change sensor every 15 days. 6 each 3   Dulaglutide  (TRULICITY ) 0.75 MG/0.5ML SOAJ Inject 0.75 mg into the skin once a week. 6 mL 1   Glucose Blood (BLOOD GLUCOSE TEST STRIPS 333) STRP 1 Device by Other route daily. 100 strip 2   JARDIANCE  10 MG TABS tablet TAKE 1 TABLET DAILY BEFORE BREAKFAST 90 tablet 1   MAGNESIUM  PO Take by mouth daily.     metFORMIN  (GLUCOPHAGE -XR) 750 MG 24 hr tablet Take 1 tablet (750 mg total) by mouth in the morning and at bedtime. 180 tablet 3   MISC NATURAL PRODUCTS PO Take 1 capsule by mouth daily. Liver Well     Multiple Vitamin (MULTIVITAMIN) tablet Take 1 tablet by mouth daily.     ramipril  (ALTACE ) 10 MG capsule TAKE 2 CAPSULES DAILY 180 capsule 3   rosuvastatin  (CRESTOR ) 10 MG tablet Take 1 tablet (10 mg total) by mouth daily. 90 tablet 3   topiramate  (TOPAMAX ) 25 MG tablet Take 1 tablet (25 mg total) by mouth 2 (two) times daily. 120 tablet 3   No current facility-administered medications on file prior to visit.   Wt Readings from Last 3 Encounters:  04/04/24 151 lb (68.5 kg)  01/03/24 148 lb (67.1 kg)  01/03/24 148 lb  12.8 oz (67.5 kg)   Ht Readings from Last 3 Encounters:  04/04/24 5' 9 (1.753 m)  01/03/24 5' 9 (1.753 m)  01/03/24 5' 9.6 (1.768 m)   There is no height or weight on file to calculate BMI. @BMIFA @ Facility age limit for growth %iles is 20 years. Facility age limit for growth %iles is 20 years.   Labs: SABRA Notable Signs/Symptoms: gets light headed at time in middle of night  Lifestyle & Dietary Hx Lives with wife. On disability from CVA.  Estimated daily fluid intake: 40 oz Supplements: Mg, Ashwagonda Sleep:  Stress / self-care:  Current average weekly physical activity: ADL  24-Hr Dietary Recall Eating 3 better balanced meals. More vegetables and fruit from garden. Eating more home cooked meals. Drinking more water . Limiting Dt soda some.   Estimated Energy Needs Calories: 2000 Carbohydrate: 225g Protein: 150g Fat: 56g   NUTRITION DIAGNOSIS  NB-1.1 Food and nutrition-related knowledge deficit As related to excessive carb, fast and sugar intake.  As evidenced by A1C 8.1%. .   NUTRITION INTERVENTION  Nutrition education (E-1) on the following topics:  Nutrition and Diabetes education provided on My Plate, CHO counting, meal planning, portion sizes, timing of meals, avoiding snacks between meals unless having a low blood sugar, target ranges for A1C and blood sugars, signs/symptoms and treatment of hyper/hypoglycemia, monitoring blood sugars, taking medications as prescribed, benefits of exercising 30 minutes per day and prevention of complications of DM.  Lifestyle Medicine  - Whole Food, Plant Predominant Nutrition is highly recommended: Eat Plenty of vegetables, Mushrooms, fruits, Legumes, Whole Grains, Nuts, seeds in lieu of processed meats, processed snacks/pastries red meat, poultry, eggs.    -It is better to avoid simple carbohydrates including: Cakes, Sweet Desserts, Ice Cream, Soda (diet and regular), Sweet Tea, Candies, Chips, Cookies, Store Bought Juices,  Alcohol in Excess of  1-2 drinks a day, Lemonade,  Artificial Sweeteners, Doughnuts, Coffee Creamers, Sugar-free Products, etc, etc.  This is not a complete list.....  Exercise: If you are able: 30 -60 minutes a day ,4 days a week, or 150 minutes a week.  The longer the better.  Combine stretch, strength, and aerobic activities.  If you were told  in the past that you have high risk for cardiovascular diseases, you may seek evaluation by your heart doctor prior to initiating moderate to intense exercise programs.   Handouts Provided Include  Know your numbers Lifestyle Medicine  Learning Style & Readiness for Change Teaching method utilized: Visual & Auditory  Demonstrated degree of understanding via: Teach Back  Barriers to learning/adherence to lifestyle change:    Goals Established by Pt  Keep focusing on more whole plant based foods Increase water  to 80-120 oz per day Increase fiber to 38 grams per day. Focus on foods from a garden Increase physical activity as tolerated. Don't eat past 7 pm Get A1C to 6.5% or below   MONITORING & EVALUATION Dietary intake, weekly physical activity, and blood sugars in 3 months.  Next Steps  Patient is to work on eating better consistent meals from whole plant based foods.SABRA

## 2024-04-04 NOTE — Patient Instructions (Signed)
 Labs today.   Complete cologuard.  Follow up in 3-6 months.

## 2024-04-05 ENCOUNTER — Ambulatory Visit: Payer: Self-pay | Admitting: Family Medicine

## 2024-04-05 DIAGNOSIS — Z8673 Personal history of transient ischemic attack (TIA), and cerebral infarction without residual deficits: Secondary | ICD-10-CM | POA: Insufficient documentation

## 2024-04-05 LAB — HEMOGLOBIN A1C
Est. average glucose Bld gHb Est-mCnc: 160 mg/dL
Hgb A1c MFr Bld: 7.2 % — ABNORMAL HIGH (ref 4.8–5.6)

## 2024-04-05 NOTE — Assessment & Plan Note (Signed)
 Stable. Continue Crestor.

## 2024-04-05 NOTE — Assessment & Plan Note (Signed)
 A1c 7.2. Advised patient to consider increase in Trulicity .

## 2024-04-05 NOTE — Assessment & Plan Note (Signed)
 Stable.  Continue current medications.

## 2024-04-05 NOTE — Progress Notes (Signed)
 Subjective:  Patient ID: Starleen LULLA Ada, male    DOB: [birth year]  Age: 63 y.o. MRN: [id number]  CC:   Chief Complaint  Patient presents with   3 month follow up     Diabetes     HPI:  63 year old male with history of stroke, HTN, DM-2, HLD presents for follow up.  HTN stable on Norvasc , Ramipril , and Ziac .  Needs A1c today. Last A1c was 7.0. Compliant with Metformin , Jardiance  and Trulicity . Tolerating Trulicity  well.  Doesn't like the [employer]. Prefers to just check blood sugar via finger stick.  Last LDL was 68.   Advised to complete cologuard.  Patient Active Problem List   Diagnosis Date Noted   History of stroke 04/05/2024   Skin cancer of face 04/03/2024   Depression, major, single episode, moderate (HCC) 11/12/2022   Atherosclerosis of aorta 02/11/2022   Type 2 diabetes mellitus with other circulatory complications (HCC) 02/10/2022   ASCVD (arteriosclerotic cardiovascular disease) 11/26/2021   Hyperlipidemia 07/21/2021   Fatty liver 03/25/2020   HTN (hypertension) 10/26/2012    Social Hx   Social History   Socioeconomic History   Marital status: Married    Spouse name: Not on file   Number of children: Not on file   Years of education: Not on file   Highest education level: 12th grade  Occupational History   Not on file  Tobacco Use   Smoking status: Never   Smokeless tobacco: Never  Vaping Use   Vaping status: Never Used  Substance and Sexual Activity   Alcohol use: No   Drug use: No   Sexual activity: Not on file  Other Topics Concern   Not on file  Social History Narrative   Not on file   Social Drivers of Health   Financial Resource Strain: Low Risk  (04/04/2024)   Overall Financial Resource Strain (CARDIA)    Difficulty of Paying Living Expenses: Not very hard  Food Insecurity: No Food Insecurity (04/04/2024)   Hunger Vital Sign    Worried About Running Out of Food in the Last Year: Never true    Ran Out of Food in the Last Year:  Never true  Transportation Needs: No Transportation Needs (04/04/2024)   PRAPARE - Administrator, Civil Service (Medical): No    Lack of Transportation (Non-Medical): No  Physical Activity: Not on file  Stress: Stress Concern Present (04/04/2024)   [employer] of Occupational Health - Occupational Stress Questionnaire    Feeling of Stress: Very much  Social Connections: Socially Integrated (04/04/2024)   Social Connection and Isolation Panel    Frequency of Communication with Friends and Family: Three times a week    Frequency of Social Gatherings with Friends and Family: Once a week    Attends Religious Services: More than 4 times per year    Active Member of [employer] or Organizations: Yes    Attends Engineer, Structural: More than 4 times per year    Marital Status: Married    Review of Systems Per HPI  Objective:  BP 135/78   Pulse 85   Temp 98.2 F (36.8 C)   Ht 5' 9 (1.753 m)   Wt 151 lb (68.5 kg)   SpO2 97%   BMI 22.30 kg/m      04/04/2024    4:19 PM 04/04/2024    3:26 PM 01/03/2024    5:24 PM  BP/Weight  Systolic BP  135   Diastolic BP  78   Wt. (Lbs) 151 151 148  BMI 22.63 kg/m2 22.3 kg/m2 21.86 kg/m2    Physical Exam Vitals and nursing note reviewed.  Constitutional:      General: He is not in acute distress.    Appearance: Normal appearance.  HENT:     Head: Normocephalic and atraumatic.  Eyes:     General:        Right eye: No discharge.        Left eye: No discharge.     Conjunctiva/sclera: Conjunctivae normal.  Cardiovascular:     Rate and Rhythm: Normal rate and regular rhythm.  Pulmonary:     Effort: Pulmonary effort is normal.     Breath sounds: Normal breath sounds. No wheezing, rhonchi or rales.  Neurological:     Mental Status: He is alert.  Psychiatric:        Mood and Affect: Mood normal.        Behavior: Behavior normal.     Lab Results  Component Value Date   WBC 8.1 11/11/2022   HGB 17.2  11/11/2022   HCT 53.9 (H) 11/11/2022   PLT 197 11/11/2022   GLUCOSE 132 (H) 12/01/2023   CHOL 132 12/01/2023   TRIG 112 12/01/2023   HDL 44 12/01/2023   LDLCALC 68 12/01/2023   ALT 42 12/01/2023   AST 33 12/01/2023   NA 140 12/01/2023   K 4.4 12/01/2023   CL 98 12/01/2023   CREATININE 0.86 12/01/2023   BUN 19 12/01/2023   CO2 21 12/01/2023   PSA 0.31 05/31/2014   INR 1.0 10/05/2021   HGBA1C 7.2 (H) 04/04/2024   MICROALBUR 2.60 (H) 04/29/2013     Assessment & Plan:  Type 2 diabetes mellitus with other circulatory complications (HCC) Assessment & Plan: A1c 7.2. Advised patient to consider increase in Trulicity .  Orders: -     Hemoglobin A1c  History of stroke  Primary hypertension Assessment & Plan: Stable. Continue current medications.   Hyperlipidemia, unspecified hyperlipidemia type Assessment & Plan: Stable. Continue Crestor .     Follow-up:  3-6 months  Arnisha Laffoon Bluford DO [facility]

## 2024-04-11 ENCOUNTER — Encounter: Payer: Self-pay | Admitting: Nutrition

## 2024-04-11 NOTE — Patient Instructions (Addendum)
 Goals Keep focusing on more whole plant based foods Increase water  to 80-120 oz per day Increase fiber to 38 grams per day. Focus on foods from a garden Increase physical activity as tolerated. Don't eat past 7 pm Get A1C to 6.5% or below

## 2024-05-02 ENCOUNTER — Other Ambulatory Visit: Payer: Self-pay | Admitting: Family Medicine

## 2024-05-02 DIAGNOSIS — E1159 Type 2 diabetes mellitus with other circulatory complications: Secondary | ICD-10-CM

## 2024-07-25 ENCOUNTER — Ambulatory Visit: Admitting: Neurology

## 2024-08-09 ENCOUNTER — Ambulatory Visit: Admitting: Neurology

## 2024-08-14 ENCOUNTER — Ambulatory Visit: Admitting: Family Medicine

## 2024-08-16 ENCOUNTER — Encounter: Admitting: Nutrition
